# Patient Record
Sex: Female | Born: 1965 | Race: White | Hispanic: No | Marital: Married | Smoking: Never smoker
Health system: Southern US, Community
[De-identification: ages and names within clinical notes are randomized; demographics above are authoritative.]

## PROBLEM LIST (undated history)

## (undated) DIAGNOSIS — E611 Iron deficiency: Secondary | ICD-10-CM

## (undated) DIAGNOSIS — E785 Hyperlipidemia, unspecified: Secondary | ICD-10-CM

## (undated) DIAGNOSIS — F32A Depression, unspecified: Secondary | ICD-10-CM

## (undated) DIAGNOSIS — F329 Major depressive disorder, single episode, unspecified: Secondary | ICD-10-CM

## (undated) DIAGNOSIS — N959 Unspecified menopausal and perimenopausal disorder: Secondary | ICD-10-CM

## (undated) DIAGNOSIS — G43909 Migraine, unspecified, not intractable, without status migrainosus: Secondary | ICD-10-CM

## (undated) HISTORY — DX: Migraine, unspecified, not intractable, without status migrainosus: G43.909

## (undated) HISTORY — DX: Depression, unspecified: F32.A

## (undated) HISTORY — DX: Unspecified menopausal and perimenopausal disorder: N95.9

## (undated) HISTORY — DX: Major depressive disorder, single episode, unspecified: F32.9

## (undated) HISTORY — DX: Hyperlipidemia, unspecified: E78.5

## (undated) HISTORY — PX: DENTAL SURGERY: SHX609

---

## 2004-10-15 ENCOUNTER — Ambulatory Visit: Payer: Self-pay | Admitting: Internal Medicine

## 2005-06-16 ENCOUNTER — Ambulatory Visit: Payer: Self-pay | Admitting: Internal Medicine

## 2006-07-18 ENCOUNTER — Ambulatory Visit: Payer: Self-pay | Admitting: Internal Medicine

## 2007-08-04 ENCOUNTER — Ambulatory Visit: Payer: Self-pay | Admitting: Internal Medicine

## 2007-08-21 ENCOUNTER — Encounter: Payer: Self-pay | Admitting: Internal Medicine

## 2007-08-21 ENCOUNTER — Ambulatory Visit (HOSPITAL_COMMUNITY): Admission: RE | Admit: 2007-08-21 | Discharge: 2007-08-21 | Payer: Self-pay | Admitting: Internal Medicine

## 2007-08-21 ENCOUNTER — Ambulatory Visit: Payer: Self-pay | Admitting: Internal Medicine

## 2008-09-16 DIAGNOSIS — K5190 Ulcerative colitis, unspecified, without complications: Secondary | ICD-10-CM

## 2008-09-16 DIAGNOSIS — F329 Major depressive disorder, single episode, unspecified: Secondary | ICD-10-CM

## 2008-09-17 ENCOUNTER — Ambulatory Visit: Payer: Self-pay | Admitting: Internal Medicine

## 2008-09-18 ENCOUNTER — Encounter: Payer: Self-pay | Admitting: Internal Medicine

## 2008-09-18 ENCOUNTER — Encounter (INDEPENDENT_AMBULATORY_CARE_PROVIDER_SITE_OTHER): Payer: Self-pay | Admitting: *Deleted

## 2008-09-18 LAB — CONVERTED CEMR LAB
CO2: 22 meq/L
Chloride: 105 meq/L
Glucose, Bld: 135 mg/dL
Potassium: 3.9 meq/L
Sodium: 139 meq/L

## 2008-09-30 LAB — CONVERTED CEMR LAB
Calcium: 8.6 mg/dL (ref 8.4–10.5)
Glucose, Bld: 135 mg/dL — ABNORMAL HIGH (ref 70–99)
Potassium: 3.9 meq/L (ref 3.5–5.3)
Sodium: 139 meq/L (ref 135–145)

## 2008-10-08 ENCOUNTER — Encounter (INDEPENDENT_AMBULATORY_CARE_PROVIDER_SITE_OTHER): Payer: Self-pay | Admitting: *Deleted

## 2008-10-08 LAB — CONVERTED CEMR LAB
ALT: 16 units/L
BUN: 11 mg/dL
Calcium: 8.5 mg/dL
Cholesterol: 180 mg/dL
Creatinine, Ser: 0.72 mg/dL
Glomerular Filtration Rate, Af Am: >59 mL/min/{1.73_m2}
Glucose, Bld: 78 mg/dL
HCT: 38.4 %
HDL: 60 mg/dL
MCV: 93 fL
Platelets: 309 10*3/uL
Triglycerides: 123 mg/dL
Vit D, 25-Hydroxy: 42.4 ng/mL

## 2009-07-25 ENCOUNTER — Encounter: Payer: Self-pay | Admitting: Internal Medicine

## 2009-08-06 HISTORY — PX: COLONOSCOPY: SHX174

## 2009-08-11 ENCOUNTER — Ambulatory Visit: Payer: Self-pay | Admitting: Internal Medicine

## 2009-08-11 ENCOUNTER — Ambulatory Visit (HOSPITAL_COMMUNITY): Admission: RE | Admit: 2009-08-11 | Discharge: 2009-08-11 | Payer: Self-pay | Admitting: Internal Medicine

## 2009-08-13 ENCOUNTER — Encounter: Payer: Self-pay | Admitting: Internal Medicine

## 2009-08-25 ENCOUNTER — Encounter: Payer: Self-pay | Admitting: Internal Medicine

## 2009-08-29 ENCOUNTER — Encounter (INDEPENDENT_AMBULATORY_CARE_PROVIDER_SITE_OTHER): Payer: Self-pay

## 2010-04-09 NOTE — Letter (Signed)
Summary: Recall Colonoscopy/Endoscopy, Change to Office Visit  [facility]  [address]   [place], [place] [address]   Phone: [phone]  Fax: [phone]      August 29, 2009   [facility] [address] [place], [place]  [address] 1965-11-17   Dear Ms. Maddison,   According to our records, it is time for you to schedule a Colonoscopy/Endoscopy. However, after reviewing your medical record, we recommend an office visit in order to determine your need for a repeat procedure.  Please call [phone] at your convenience to schedule an office visit. If you have any questions or concerns, please feel free to contact our office.   Sincerely,   Waldon Merl LPN  [facility] Ph: [phone]   Fax: [phone]

## 2010-04-09 NOTE — Letter (Signed)
Summary: pathreport  pathreport   Imported By: Orma Flaming 08/25/2009 10:27:11  _____________________________________________________________________  External Attachment:    Type:   Image     Comment:   External Document

## 2010-04-09 NOTE — Letter (Signed)
Summary: Patient Notice, Colon Biopsy Results  [facility]  [address]   [place], [place] [address]   Phone: [phone]  Fax: [phone]       August 13, 2009   [facility] [address] [place],   [address] [birth year]    Dear Debra Taylor,  I am pleased to inform you that the biopsies taken during your recent colonoscopy did not show any evidence of cancer upon pathologic examination.  Colitis appears to be in remission which is great news.  Additional information/recommendations:  Continue with the treatment plan as outlined on the day of your exam.  You should have a repeat colonoscopy examination  in 2 years.  Please call us if you are having persistent problems or have questions about your condition that have not been fully answered at this time.  Sincerely,    R. Garfield Cornea MD, [facility] Ph: [phone]    Fax: [phone]   Appended Document: Patient Notice, Colon Biopsy Results letter mailed to pt  Appended Document: Patient Notice, Colon Biopsy Results reminder in computer

## 2010-04-09 NOTE — Letter (Signed)
Summary: Internal Other Lynne Logan  Internal Other Lynne Logan   Imported By: Waldon Merl LPN 91/36/8599 23:41:44  _____________________________________________________________________  External Attachment:    Type:   Image     Comment:   External Document

## 2010-04-22 ENCOUNTER — Encounter (INDEPENDENT_AMBULATORY_CARE_PROVIDER_SITE_OTHER): Payer: Self-pay | Admitting: *Deleted

## 2010-04-24 ENCOUNTER — Encounter: Payer: Self-pay | Admitting: Cardiology

## 2010-04-24 ENCOUNTER — Ambulatory Visit (INDEPENDENT_AMBULATORY_CARE_PROVIDER_SITE_OTHER): Payer: BC Managed Care – PPO | Admitting: Cardiology

## 2010-04-24 DIAGNOSIS — R079 Chest pain, unspecified: Secondary | ICD-10-CM

## 2010-04-24 DIAGNOSIS — E785 Hyperlipidemia, unspecified: Secondary | ICD-10-CM | POA: Insufficient documentation

## 2010-04-27 ENCOUNTER — Encounter (INDEPENDENT_AMBULATORY_CARE_PROVIDER_SITE_OTHER): Payer: Self-pay | Admitting: *Deleted

## 2010-04-27 LAB — CONVERTED CEMR LAB
ALT: 13 units/L
AST: 16 units/L
Alkaline Phosphatase: 57 units/L
Cholesterol: 151 mg/dL
GFR calc non Af Amer: 104 mL/min
Glomerular Filtration Rate, Af Am: 120 mL/min/{1.73_m2}
Glucose, Bld: 91 mg/dL
HCT: 40.5 %
Hemoglobin: 13.6 g/dL
LDL Cholesterol: 83 mg/dL
Potassium: 4.4 meq/L
Sodium: 140 meq/L
TSH: 2.27 microintl units/mL
Triglycerides: 70 mg/dL

## 2010-04-29 NOTE — Miscellaneous (Signed)
Summary: labs bmp,09/18/2008  Clinical Lists Changes  Observations: Added new observation of CALCIUM: 8.6 mg/dL (09/18/2008 11:05) Added new observation of CREATININE: 0.71 mg/dL (09/18/2008 11:05) Added new observation of BUN: 11 mg/dL (09/18/2008 11:05) Added new observation of BG RANDOM: 135 mg/dL (09/18/2008 11:05) Added new observation of CO2 PLSM/SER: 22 meq/L (09/18/2008 11:05) Added new observation of CL SERUM: 105 meq/L (09/18/2008 11:05) Added new observation of K SERUM: 3.9 meq/L (09/18/2008 11:05) Added new observation of NA: 139 meq/L (09/18/2008 11:05)

## 2010-04-29 NOTE — Letter (Signed)
Summary: Stress Echocardiogram Information Sheet  [facility]. [address] [place] [address]   Phone: [phone]  Fax: [phone]      April 24, 2010 MRN: [id number] light prior to the test.   Bradly Chris  Doctor: Appointment Date: Appointment Time: Appointment Location: [facility]  Stress Echocardiogram Information Sheet    Instructions:   1. You may take your medications the morning of test.  2. Do not eat or drink after midnight the night before test.  3. Dress prepared to exercise.  4. DO NOT use ANY caffine or tobacco products 3 hours before appointment.  5. Report to the [place] on the1st floor.  6. Please bring all current prescription medications.  7. If you have any questions, please call [phone]

## 2010-05-05 NOTE — Miscellaneous (Signed)
Summary: cmp,cbc,lipid  Clinical Lists Changes  Observations: Added new observation of VIT D 25-OH: 42.4 ng/mL (10/08/2008 14:06) Added new observation of CALCIUM: 8.5 mg/dL (10/08/2008 14:06) Added new observation of ALBUMIN: 3.7 g/dL (10/08/2008 14:06) Added new observation of PROTEIN, TOT: 6.8 g/dL (10/08/2008 14:06) Added new observation of SGPT (ALT): 16 units/L (10/08/2008 14:06) Added new observation of SGOT (AST): 21 units/L (10/08/2008 14:06) Added new observation of ALK PHOS: 59 units/L (10/08/2008 14:06) Added new observation of BILI DIRECT: total bili   0.2 mg/dL (10/08/2008 14:06) Added new observation of GFR AA: >59 mL/min/1.43m (10/08/2008 14:06) Added new observation of GFR: >59 mL/min (10/08/2008 14:06) Added new observation of CREATININE: 0.72 mg/dL (10/08/2008 14:06) Added new observation of BUN: 11 mg/dL (10/08/2008 14:06) Added new observation of BG RANDOM: 78 mg/dL (10/08/2008 14:06) Added new observation of CO2 PLSM/SER: 22 meq/L (10/08/2008 14:06) Added new observation of CL SERUM: 102 meq/L (10/08/2008 14:06) Added new observation of K SERUM: 4.2 meq/L (10/08/2008 14:06) Added new observation of NA: 136 meq/L (10/08/2008 14:06) Added new observation of LDL: 95 mg/dL (10/08/2008 14:06) Added new observation of HDL: 60 mg/dL (10/08/2008 14:06) Added new observation of TRIGLYC TOT: 123 mg/dL (10/08/2008 14:06) Added new observation of CHOLESTEROL: 180 mg/dL (10/08/2008 14:06) Added new observation of PLATELETK/UL: 309 K/uL (10/08/2008 14:06) Added new observation of MCV: 93 fL (10/08/2008 14:06) Added new observation of HCT: 38.4 % (10/08/2008 14:06) Added new observation of HGB: 12.9 g/dL (10/08/2008 14:06) Added new observation of WBC COUNT: 6.1 10*3/microliter (10/08/2008 14:06)

## 2010-05-05 NOTE — Assessment & Plan Note (Signed)
Summary: [facility]   Visit Type:  Initial Consult Referring Provider:  [facility] Primary Provider:  [facility]-Dr. Gar Ponto   History of Present Illness: Debra Taylor is evaluated in the office today at the kind request of Dr. Gar Ponto for chest discomfort.  This nice young woman has enjoyed generally excellent health except for a 10-15 year history of ulcerative colitis, which has been fairly well controlled with medical therapy.  She has no known cardiac disease and has never previously been evaluated by a cardiologist.  There is no history of hypertension, hyperlipidemia or diabetes.  She does have a positive family history with both her father and one of 2 brothers suffered myocardial infarction at fairly young ages.  For approximately the past year, [name] has experienced intermittent chest discomfort unrelated to exertion.  This is of mild to moderate severity, substernal without radiation or associated symptoms.  A recent more severe and prolonged episode prompted this referral.  Current Medications (verified): 1)  Asacol 400 Mg Tbec (Mesalamine) .... Take 2 Tabs Twice A Day 2)  Maxalt 10 Mg Tabs (Rizatriptan Benzoate) .... As Needed 3)  Vitamin D 1000 Unit Tabs (Cholecalciferol) .... Once Daily 4)  Tri-Sprintec 0.18/0.215/0.25 Mg-35 Mcg Tabs (Norgestim-Eth Estrad Triphasic) .... Once Daily 5)  Topamax 25 Mg Tabs (Topiramate) .... Take 1 Tab Two Times A Day 6)  Tums 500 Mg Chew (Calcium Carbonate Antacid) .... 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[employer] Tobacco-none Alcohol Use - no  Past Medical History: Chest pain Hyperlipidemia Premenopausal-contraception Depression Ulcerative colitis-onset age 7 Migraine headaches  Past Surgical History: Colonoscopy-08/2009  CXR  Procedure date:  10/14/2009  Findings:      Normal cardiac silhouette Low-lying diaphragm Within normal limits  EKG  Procedure date:  10/14/2009  Findings:      Sinus bradycardia at a rate of 44 Otherwise within normal limits   Family History: Father: alive at age 46- MI, phlebitis,breathing problems Mother: alive at age 50 died- ovarian and uterine cancer Siblings:2 brothers, one of whom has suffered myocardial infarction in his 30s  No FH of Colon Cancer:  Social History: Marital Status: Married; no children Children: none Occupation: Probation officer [employer] Tobacco-none Alcohol Use - no  Review of Systems       History of migraine headaches; corrective lenses required; oral contraceptives utilized for birth control.  All other systems reviewed and are negative.  Vital Signs:  Patient profile:   45 year old female Weight:      159 pounds BMI:     27.39 O2 Sat:      92 % on Room air Pulse rate:   63 / minute BP sitting:   109 / 67  (left arm)  Vitals Entered By: Doretha Sou, CNA (April 24, 2010 9:48 AM)  O2 Flow:  Room air  Physical Exam  General:  Thin; well-developed; no acute distress: HEENT-[place]/AT; PERRL; EOM intact; conjunctiva and lids nl; arched palate Neck-No JVD; no carotid bruits: Endocrine-No thyromegaly: Lungs-No tachypnea, clear without rales, rhonchi or wheezes; straight back CV-normal PMI; normal S1 and S2:;  Abdomen-BS normal; soft  and non-tender without masses or organomegaly: MS-No deformities, cyanosis or clubbing: Neurologic-Nl cranial nerves; symmetric strength and tone: Skin- Warm, no sig. lesions: Extremities-Nl distal pulses; no edema;  no arachnodactyly    Impression & Recommendations:  Problem # 1:  CHEST PAIN (ICD-786.50) Chest discomfort is atypical, and risk factors are few.  Patient has some anatomic features associated with mitral valve prolapse, but no findings on exam to suggest this diagnosis.  A stress echocardiogram will be performed to exclude ischemic heart disease and structural heart disease.  Current symptoms are most suggestive of a GI etiology.  Empiric therapy with antacids suggested.  If stress testing is negative,  I will reevaluate this nice woman's symptoms in 3 months.  Problem # 2:  HYPERLIPIDEMIA (YBR-493.4) Patient has a vague history of hyperlipidemia; lipid profile will be obtained.  Other Orders: Stress Echo (Stress Echo)  Patient Instructions: 1)  Your physician recommends that you schedule a follow-up appointment in: 3 months 2)  Your physician has recommended you make the following change in your medication: You may take Tums or Mylanta for chest pain 3)  Your physician has requested that you have a stress echocardiogram. For further information please visit [email or web address].  Please follow instruction sheet as given.

## 2010-05-05 NOTE — Miscellaneous (Addendum)
Summary: cmp,cbc,lipid,lipid,tsh per Dr. Olena Heckle  Clinical Lists Changes  Observations: Added new observation of CALCIUM: 8.9 mg/dL (04/27/2010 14:14) Added new observation of ALBUMIN: 4.0 g/dL (04/27/2010 14:14) Added new observation of PROTEIN, TOT: 7.0 g/dL (04/27/2010 14:14) Added new observation of SGPT (ALT): 13 units/L (04/27/2010 14:14) Added new observation of SGOT (AST): 16 units/L (04/27/2010 14:14) Added new observation of ALK PHOS: 57 units/L (04/27/2010 14:14) Added new observation of BILI DIRECT: total bili   0.2 mg/dL (04/27/2010 14:14) Added new observation of GFR AA: 120 mL/min/1.100m (04/27/2010 14:14) Added new observation of GFR: 104 mL/min (04/27/2010 14:14) Added new observation of CREATININE: 0.71 mg/dL (04/27/2010 14:14) Added new observation of BUN: 7 mg/dL (04/27/2010 14:14) Added new observation of BG RANDOM: 91 mg/dL (04/27/2010 14:14) Added new observation of CO2 PLSM/SER: 21 meq/L (04/27/2010 14:14) Added new observation of CL SERUM: 106 meq/L (04/27/2010 14:14) Added new observation of K SERUM: 4.4 meq/L (04/27/2010 14:14) Added new observation of NA: 140 meq/L (04/27/2010 14:14) Added new observation of LDL: 83 mg/dL (04/27/2010 14:14) Added new observation of HDL: 54 mg/dL (04/27/2010 14:14) Added new observation of TRIGLYC TOT: 70 mg/dL (04/27/2010 14:14) Added new observation of CHOLESTEROL: 151 mg/dL (04/27/2010 14:14) Added new observation of PLATELETK/UL: 289 K/uL (04/27/2010 14:14) Added new observation of MCV: 94 fL (04/27/2010 14:14) Added new observation of HCT: 40.5 % (04/27/2010 14:14) Added new observation of HGB: 13.6 g/dL (04/27/2010 14:14) Added new observation of WBC COUNT: 6.2 10*3/microliter (04/27/2010 14:14) Added new observation of TSH: 2.270 microintl units/mL (04/27/2010 14:14)

## 2010-05-07 ENCOUNTER — Ambulatory Visit (HOSPITAL_COMMUNITY)
Admission: RE | Admit: 2010-05-07 | Discharge: 2010-05-07 | Disposition: A | Discharge status: Home / Self Care | Payer: BC Managed Care – PPO | Source: Ambulatory Visit | Attending: Cardiology | Admitting: Cardiology

## 2010-05-07 ENCOUNTER — Ambulatory Visit (HOSPITAL_COMMUNITY): Payer: BC Managed Care – PPO | Attending: Cardiology

## 2010-05-07 ENCOUNTER — Encounter: Payer: Self-pay | Admitting: Cardiology

## 2010-05-07 DIAGNOSIS — R0789 Other chest pain: Secondary | ICD-10-CM | POA: Insufficient documentation

## 2010-05-07 DIAGNOSIS — R072 Precordial pain: Secondary | ICD-10-CM

## 2010-05-07 DIAGNOSIS — R079 Chest pain, unspecified: Secondary | ICD-10-CM | POA: Insufficient documentation

## 2010-05-15 ENCOUNTER — Encounter: Payer: Self-pay | Admitting: Internal Medicine

## 2010-05-19 NOTE — Letter (Signed)
Summary: CLINIC NOTE FROM DR [facility] NOTE FROM DR Quillian Quince   Imported By: Hoy Morn 05/15/2010 10:55:15  _____________________________________________________________________  External Attachment:    Type:   Image     Comment:   External Document

## 2010-05-25 LAB — BASIC METABOLIC PANEL
BUN: 7 mg/dL (ref 6–23)
CO2: 26 mEq/L (ref 19–32)
Chloride: 105 mEq/L (ref 96–112)
Creatinine, Ser: 0.66 mg/dL (ref 0.4–1.2)
Glucose, Bld: 82 mg/dL (ref 70–99)
Potassium: 3.6 mEq/L (ref 3.5–5.1)

## 2010-06-18 ENCOUNTER — Ambulatory Visit: Payer: BC Managed Care – PPO | Attending: Family Medicine | Admitting: Audiology

## 2010-06-18 DIAGNOSIS — Z0110 Encounter for examination of ears and hearing without abnormal findings: Secondary | ICD-10-CM | POA: Insufficient documentation

## 2010-06-18 DIAGNOSIS — Z0389 Encounter for observation for other suspected diseases and conditions ruled out: Secondary | ICD-10-CM | POA: Insufficient documentation

## 2010-07-21 NOTE — H&P (Signed)
Debra Taylor, Debra Taylor              ACCOUNT NO.:  [id number]   MEDICAL RECORD NO.:  [id number]          PATIENT TYPE:  AMB   LOCATION:  DAY                           FACILITY:  [facility]   PHYSICIAN:  R. Garfield Cornea, M.D. DATE OF BIRTH:  [birth year]   DATE OF ADMISSION:  DATE OF DISCHARGE:  LH                              HISTORY & PHYSICAL   PRIMARY CARE Jesilyn Easom:  Gar Ponto, MD, [place], [place].   CHIEF COMPLAINT:  A 10 plus years history of UC.   HISTORY:  Debra Taylor is a very pleasant 45 year old lady with  well-documented ulcerative colitis who has been maintained in remission  on Asacol 2 tablets orally twice daily.  She has one bowel movement  daily.  She has not had any tenesmus, abdominal pain, or hematochezia.  Last colonoscopy was in 2000 done by me at [facility].  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  She has done well,  although she has had flares and temporarily related to antibiotic  therapy along the way.  She is not taking any nonsteroidals.  She has  not had any intercurrent illness, although she tells me her B12 and iron  level maybe a little low, and she was having lab work done at Dr.  Arcola Jansky office.  I do not see a recent creatinine on this nice lady.  She was last seen here on Jul 18, 2006.   There is no family history of GI malignancy, although her grandfather  has colonic polyps.   PAST MEDICAL HISTORY:  Significant for ulcerative colitis and  depression.  She has seen Dr. Clarene Essex down in [place] back in 1996  and had a flexible sigmoidoscopy without any definitive findings.   CURRENT MEDICATIONS:  1. Asacol 800 mg b.i.d.  2. Prozac 20 mg daily.  3. Maxalt 10 mg p.r.n.  4. Tylenol p.r.n.  5. Vitamin B12 injections.  6. Birth control pill daily.   ALLERGIES:  No known drug allergies.   FAMILY HISTORY:  As outlined above.  There is no history of inflammatory  bowel disease.   SOCIAL HISTORY:  The  patient is married, lives in [place].  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  She  rarely consumes alcohol.  No tobacco use.   REVIEW OF SYSTEMS:  No chest pain.  No dyspnea on exertion.  No fever.  No odynophagia, dysphagia, early satiety, reflux symptoms, nausea, or  vomiting.  Weight is stable at 163 pounds.   PHYSICAL EXAMINATION:  GENERAL:  This is a pleasant, well-groomed 38-  year-old lady.  VITAL SIGNS:  Weight 163, height 5 feet 4 inches, temperature 97.5,  blood pressure 112/70, and pulse 80.  SKIN:  Warm and dry.  There is no jaundice.  HEENT:  No scleral icterus.  Conjunctivae pink.  CHEST/LUNGS:  Clear to auscultation.  CARDIAC:  Regular rate and rhythm without murmur, gallop, or rub.  ABDOMEN:  Nondistended.  Positive bowel sounds.  Soft and entirely  nontender without appreciable mass or organomegaly.  EXTREMITIES:  No  edema.  RECTAL:  Deferred at the time of colonoscopy.   IMPRESSION:  Debra Taylor is a 45 year old lady with panulcerative  colitis nicely maintained in remission on relative low dose of Asacol.  She has probably had the disease for a good 14 years, although  definitive diagnosis was not made until 2000.  Time course of disease at  this point does begin to slightly increase her risk of colon cancer.  Duration of her illness, now dictates a closer surveillance to this, and  I have offered Debra Taylor a colonoscopy with segmental biopsies to look  for dysplasia.  Risk, benefits, alternatives, and limitations have been  reviewed.  Questions answered.  All parties agreeable.   PLAN:  I will perform colonoscopy at some time in the month of June.  I  do not see where she has had a recent serum creatinine.  She had some  lab work done with Dr. Quillian Quince and is suppose to have some more lab work  done next month.  We will see that she has a recent creatinine to  document renal function while on long-term mesalamine  therapy.      Bridgette Habermann, M.D.  Electronically Signed     RMR/MEDQ  D:  08/04/2007  T:  08/05/2007  Job:  [id number]   cc:   Gar Ponto  Fax: [phone]

## 2010-07-21 NOTE — Op Note (Signed)
Debra Taylor, Debra Taylor              ACCOUNT NO.:  [id number]   MEDICAL RECORD NO.:  [id number]          PATIENT TYPE:  AMB   LOCATION:  DAY                           FACILITY:  [facility]   PHYSICIAN:  R. Garfield Cornea, M.D. DATE OF BIRTH:  [birth year]   DATE OF PROCEDURE:  08/21/2007  DATE OF DISCHARGE:                               OPERATIVE REPORT   PROCEDURE PERFORMED:  Ileocolonoscopy and segmental biopsy.   INDICATIONS FOR PROCEDURE:  This 45 year old lady with a greater than 10  years history of pain and proctocolitis, well controlled on Asacol, here  for surveillance colonoscopy.  She missed her Asacol for over a day  recently and has flare in her symptoms.  She bumped her Asacol to 2  tablets 3 times a day from b.i.d. on her own with improved control of  her symptoms.  This approach has been discussed with the patient.  Risk,  benefits, alternatives, and limitations have been reviewed.  Please see  documentation in the medical record.   PROCEDURE NOTE:  O2 saturation, blood pressure, pulse of the patient  monitored throughout the entire procedure.   CONSCIOUS SEDATION:  Versed 7 mg IV and Demerol 125 mg IV divided doses.   FINDINGS:  Digital rectal exam revealed no abnormalities.  Endoscopic  findings:  The prep was good.  Colon:  Colonic mucosa was surveyed from  the rectosigmoid junction through the left transverse and right colon,  appendiceal orifice, ileocecal valve, and cecum.  These structures were  well seen and photographed for the record.  Terminal ileum was intubated  easily and intubated to 20 cm.  From this level, scope was slowly  withdrawn and all previously mentioned mucosal surfaces were again seen.  The patient had diffuse inflammatory changes of the colonic mucosa  including granularity, friability, and superficial erosion.  The  vascular pattern was more or less preserved all the way around.  The  terminal ileum mucosa appeared normal.  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  The ileocecal valve was patulous.  There was no evidence of mass, polyp, or cancer.  Segmental biopsies  were taken on the way out as described above.  The scope was pulled down  to the rectal mucosa where the similar inflammatory changes were found  with no other abnormalities observed.  Retroflexion of the distal rectum  was undertaken.  No other abnormalities were observed above the rectal  mucosa was biopsied.  The patient tolerated the procedure well.  He was  reacted in Endoscopy.   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   RECOMMENDATIONS:  Continue Asacol 3 tablets t.i.d. for the next month  and can try dropping back to 2 tablets twice daily.  Compliance with  Asacol emphasized.      Bridgette Habermann, M.D.  Electronically Signed     RMR/MEDQ  D:  08/21/2007  T:  08/21/2007  Job:  [id number]   cc:   Gar Ponto  Fax: [phone]

## 2010-07-21 NOTE — Assessment & Plan Note (Signed)
NAMEMarland Kitchen  RAVNEET, SPILKER               CHART#:  [id number]   DATE:  07/18/2006                       DOB:  Feb 19, 1966   Followup ulcerative colitis.  Last seen June 16, 2005.   She has done well until the first of the year when she was sinus/URI.  She was given Augmentin, Z-Pak and most recently Augmentin to clear this  up when she was seen over at [facility].  Intermittently, she has had  some tenesmus and some mucous blood-like stools during the antibiotic  therapy.  Her stools started to revert to normal one week ago, and she  is having one bowel movement daily to every other day at this point in  time.  She is in for a recheck.  Has had no fever or chills.  No  abdominal pain today, no upper GI tract symptoms.  There is no family  history of colorectal neoplasia.  She is due for a surveillance  colonoscopy in 2010.   CURRENT MEDICATIONS:  See updated list.   ALLERGIES:  No known drug allergies.  She is not taking any  nonsteroidals.   Today she looks well.  Weight 163, down from 177 (she is trying to loose weight, going to  [employer], etc.). Height 5 feet 4 inches, temperature 98.1, blood  pressure 120/70, pulse 62.  SKIN:  Warm and dry.  ABDOMEN:  Flat.  Positive bowel sounds.  Entirely soft and nontender  without appreciable mass or organomegaly.   ASSESSMENT:  History of ulcerative colitis with possible flare versus  antibiotic-associated colitis superimposed on ulcerative colitis.  Symptoms clinically improved at this point in time.   RECOMMENDATIONS:  Give her a 2 week course of Align 1 capsule daily,  samples through the office.  I told her, in the future, if she does have  to go on antibiotics, she should go on them, and would also recommend  she go on a probiotic supplement for not only the period of time she is  on antibiotics, but extend probiotic therapy for another 2 weeks.  Will  also go ahead and have her return 3 hemoccult cards, but not to do them  until 4 weeks from now, give things a chance to settle down.  Assuming  they are negative, she is to continue her Asacol 2 tablets b.i.d.  Will  plan to see this nice lady back in one year's time.      Bridgette Habermann, M.D.  Electronically Signed    RMR/MEDQ  D:  07/18/2006  T:  07/19/2006  Job:  [id number]   cc:   Gar Ponto

## 2010-07-23 ENCOUNTER — Ambulatory Visit: Payer: BC Managed Care – PPO | Admitting: Cardiology

## 2011-02-02 ENCOUNTER — Encounter: Payer: Self-pay | Admitting: Cardiology

## 2011-07-28 ENCOUNTER — Encounter: Payer: Self-pay | Admitting: Gastroenterology

## 2011-08-10 ENCOUNTER — Ambulatory Visit: Payer: BC Managed Care – PPO | Admitting: Gastroenterology

## 2011-08-17 ENCOUNTER — Ambulatory Visit: Payer: BC Managed Care – PPO | Admitting: Gastroenterology

## 2011-08-18 ENCOUNTER — Encounter: Payer: Self-pay | Admitting: Internal Medicine

## 2011-08-24 ENCOUNTER — Other Ambulatory Visit: Payer: Self-pay

## 2011-08-24 ENCOUNTER — Ambulatory Visit (INDEPENDENT_AMBULATORY_CARE_PROVIDER_SITE_OTHER): Payer: BC Managed Care – PPO | Admitting: Gastroenterology

## 2011-08-24 ENCOUNTER — Encounter: Payer: Self-pay | Admitting: Gastroenterology

## 2011-08-24 VITALS — BP 108/60 | HR 49 | Temp 98.8°F | Ht 64.0 in | Wt 152.4 lb

## 2011-08-24 DIAGNOSIS — B3731 Acute candidiasis of vulva and vagina: Secondary | ICD-10-CM | POA: Insufficient documentation

## 2011-08-24 DIAGNOSIS — B373 Candidiasis of vulva and vagina: Secondary | ICD-10-CM

## 2011-08-24 DIAGNOSIS — N912 Amenorrhea, unspecified: Secondary | ICD-10-CM | POA: Insufficient documentation

## 2011-08-24 DIAGNOSIS — K5190 Ulcerative colitis, unspecified, without complications: Secondary | ICD-10-CM

## 2011-08-24 MED ORDER — FLUCONAZOLE 150 MG PO TABS: 150.0000 mg | ORAL_TABLET | Freq: Once | ORAL | Status: AC

## 2011-08-24 NOTE — Assessment & Plan Note (Signed)
Trial of diflucan 187m X1 and repeat in one week if needed. For persistent vaginal itching, she should f/u of with gyn.

## 2011-08-24 NOTE — Assessment & Plan Note (Signed)
No menstrual cycle since stopping birth control pills in 05/2011. Check HCG.

## 2011-08-24 NOTE — Patient Instructions (Addendum)
Please do not start treatment for your yeast infection until you have heard back from the pregnancy test.

## 2011-08-24 NOTE — Progress Notes (Signed)
Primary Care Physician:  Gar Ponto, MD  Primary Gastroenterologist:  Garfield Cornea, MD   Chief Complaint  Patient presents with  . Colonoscopy    HPI:  Debra Taylor is a 46 y.o. female here to schedule her 2 year surveillance colonoscopy given her h/o chronic pan ulcerative colitis. She was diagnosed around age 28. Last TCS 2011 as outlined below.  Switched to generic Asacol about two weeks ago. Increased stress for couple of months with extra classes. Increased Asacol TID due to mild UC flare. Still very gassy. Has some mild rectal bleeding but went away with increased Asacol dosing. BM when doing well, one daily. Now still one daily but not "good one" and lots of gas. No heartburn. Some epigastric bloating/gas like. Doesn't like new generic Asacol because it has to be taking one hour before meals or two hours after. Difficult to fit into schedule and finds that she is missing doses.  Current Outpatient Prescriptions  Medication Sig Dispense Refill  . calcium carbonate (TUMS - DOSED IN MG ELEMENTAL CALCIUM) 500 MG chewable tablet Chew 1 tablet by mouth as needed.        . mesalamine (ASACOL) 400 MG EC tablet Take 800 mg by mouth 2 (two) times daily.        . rizatriptan (MAXALT) 10 MG tablet Take 10 mg by mouth as needed. May repeat in 2 hours if needed       . topiramate (TOPAMAX) 25 MG tablet Take 25 mg by mouth 2 (two) times daily.          Allergies as of 08/24/2011  . (No Known Allergies)    Past Medical History  Diagnosis Date  . Chest pain   . Hyperlipidemia   . Premenopausal patient     Conctraception  . Depression   . Ulcerative colitis     Onset at age 34  . Migraine headache     Past Surgical History  Procedure Date  . Colonoscopy 08/2009    normal terminal ileum/inflammatory changes of the rectum and colon with panproctocolitis. No dysplasia on bx.     Family History  Problem Relation Age of Onset  . Uterine cancer Mother   . Ovarian cancer Mother     . Heart attack Brother   . Colon cancer Neg Hx     History   Social History  . Marital Status: Married    Spouse Name: N/A    Number of Children: N/A  . Years of Education: N/A   Occupational History  . [place] History Main Topics  . Smoking status: Never Smoker   . Smokeless tobacco: Not on file  . Alcohol Use: No  . Drug Use: No  . Sexually Active: Not on file   Other Topics Concern  . Not on file   Social History Narrative   Married, no children      ROS:  General: Negative for anorexia, weight loss, fever, chills, fatigue, weakness. Eyes: Negative for vision changes.  ENT: Negative for hoarseness, difficulty swallowing , nasal congestion. CV: Negative for chest pain, angina, palpitations, dyspnea on exertion, peripheral edema.  Respiratory: Negative for dyspnea at rest, dyspnea on exertion, cough, sputum, wheezing.  GI: See history of present illness. GU:  Negative for dysuria, hematuria, urinary incontinence, urinary frequency, nocturnal urination.  MS: Negative for joint pain, low back pain.  Derm: Negative for rash or itching.  Neuro: Negative for weakness, abnormal sensation, seizure,  frequent headaches, memory loss, confusion.  Psych: Negative for anxiety, depression, suicidal ideation, hallucinations.  Endo: Negative for unusual weight change.  Heme: Negative for bruising or bleeding. Allergy: Negative for rash or hives.    Physical Examination:  BP 108/60  Pulse 49  Temp 98.8 F (37.1 C) (Temporal)  Ht 5' 4"  (1.626 m)  Wt 152 lb 6.4 oz (69.128 kg)  BMI 26.16 kg/m2  LMP 05/17/2011   General: Well-nourished, well-developed in no acute distress.  Head: Normocephalic, atraumatic.   Eyes: Conjunctiva pink, no icterus. Mouth: Oropharyngeal mucosa moist and pink , no lesions erythema or exudate. Neck: Supple without thyromegaly, masses, or lymphadenopathy.  Lungs: Clear to auscultation bilaterally.  Heart:  Regular rate and rhythm, no murmurs rubs or gallops.  Abdomen: Bowel sounds are normal, nontender, nondistended, no hepatosplenomegaly or masses, no abdominal bruits or    hernia , no rebound or guarding.   Rectal: defer Extremities: No lower extremity edema. No clubbing or deformities.  Neuro: Alert and oriented x 4 , grossly normal neurologically.  Skin: Warm and dry, no rash or jaundice.   Psych: Alert and cooperative, normal mood and affect.  Labs: Lab Results  Component Value Date   CREATININE 0.71 04/27/2010   BUN 7 04/27/2010   NA 140 04/27/2010   K 4.4 04/27/2010   CL 106 04/27/2010   CO2 21 04/27/2010     Imaging Studies: No results found.

## 2011-08-24 NOTE — Progress Notes (Signed)
Quick Note:  Pt aware ______ 

## 2011-08-24 NOTE — Assessment & Plan Note (Signed)
Due for two year surveillace TCS given chronic pan UC. Wants Miralax prep as she has not tolerated trilytely type preps in past (N/V).  I have discussed the risks, alternatives, benefits with regards to but not limited to the risk of reaction to medication, bleeding, infection, perforation and the patient is agreeable to proceed. Written consent to be obtained.  Recent mild UC flare, better with increased Asacol to TID. Difficulty taking new generic due to need to take on empty stomach. When she has completed meds at home, I would consider switching to Lialda 2.4g once daily. She will call for RX and rebate card at that time.

## 2011-08-24 NOTE — Progress Notes (Signed)
Quick Note:  Please let pt know, pregnancy test is negative. Can start diflucan and tcs as planned. ______

## 2011-08-26 ENCOUNTER — Telehealth: Payer: Self-pay

## 2011-08-26 NOTE — Progress Notes (Signed)
Faxed to PCP

## 2011-08-26 NOTE — Telephone Encounter (Addendum)
Pt called and wanted to reschedule colonoscopy from 09/10/2011. Given appt for 09/16/2011 @ 10:30 AM. Maudie Mercury is aware. ( However Maudie Mercury said that Dr. Gala Romney will not be in OR early that day and I can schedule the pt earlier. Called back. LMOM for a return call to schedule earlier. Pt called back and was given the appt for 7:30 AM on 09/16/2011. Maudie Mercury is aware.

## 2011-08-31 ENCOUNTER — Encounter (HOSPITAL_COMMUNITY): Payer: Self-pay | Admitting: Pharmacy Technician

## 2011-09-15 MED ORDER — SODIUM CHLORIDE 0.45 % IV SOLN
Freq: Once | INTRAVENOUS | Status: AC
  Administered 2011-09-16: 07:00:00 via INTRAVENOUS

## 2011-09-16 ENCOUNTER — Encounter (HOSPITAL_COMMUNITY): Payer: Self-pay | Admitting: *Deleted

## 2011-09-16 ENCOUNTER — Ambulatory Visit (HOSPITAL_COMMUNITY)
Admission: RE | Admit: 2011-09-16 | Discharge: 2011-09-16 | Disposition: A | Discharge status: Home / Self Care | Payer: BC Managed Care – PPO | Source: Ambulatory Visit | Attending: Internal Medicine | Admitting: Internal Medicine

## 2011-09-16 ENCOUNTER — Encounter (HOSPITAL_COMMUNITY)
Admission: RE | Disposition: A | Discharge status: Home / Self Care | Payer: Self-pay | Source: Ambulatory Visit | Attending: Internal Medicine

## 2011-09-16 DIAGNOSIS — K5190 Ulcerative colitis, unspecified, without complications: Secondary | ICD-10-CM

## 2011-09-16 DIAGNOSIS — K5180 Other ulcerative colitis without complications: Secondary | ICD-10-CM | POA: Insufficient documentation

## 2011-09-16 DIAGNOSIS — Z1211 Encounter for screening for malignant neoplasm of colon: Secondary | ICD-10-CM

## 2011-09-16 DIAGNOSIS — E785 Hyperlipidemia, unspecified: Secondary | ICD-10-CM | POA: Insufficient documentation

## 2011-09-16 HISTORY — PX: COLONOSCOPY: SHX5424

## 2011-09-16 SURGERY — COLONOSCOPY
Anesthesia: Moderate Sedation

## 2011-09-16 MED ORDER — MIDAZOLAM HCL 5 MG/5ML IJ SOLN
INTRAMUSCULAR | Status: AC
  Filled 2011-09-16: qty 10

## 2011-09-16 MED ORDER — MIDAZOLAM HCL 5 MG/5ML IJ SOLN
INTRAMUSCULAR | Status: DC | PRN
  Administered 2011-09-16: 2 mg via INTRAVENOUS
  Administered 2011-09-16: 1 mg via INTRAVENOUS
  Administered 2011-09-16 (×2): 2 mg via INTRAVENOUS
  Administered 2011-09-16: 1 mg via INTRAVENOUS

## 2011-09-16 MED ORDER — MEPERIDINE HCL 100 MG/ML IJ SOLN
INTRAMUSCULAR | Status: AC
  Filled 2011-09-16: qty 2

## 2011-09-16 MED ORDER — STERILE WATER FOR IRRIGATION IR SOLN
Status: DC | PRN
  Administered 2011-09-16: 08:00:00

## 2011-09-16 MED ORDER — MEPERIDINE HCL 100 MG/ML IJ SOLN
INTRAMUSCULAR | Status: DC | PRN
  Administered 2011-09-16: 50 mg via INTRAVENOUS
  Administered 2011-09-16 (×2): 25 mg via INTRAVENOUS

## 2011-09-16 NOTE — Op Note (Signed)
[facility] [address] [place], [place]  [address]  COLONOSCOPY PROCEDURE REPORT  PATIENT:  Debra, Taylor  MR#:  [id number] BIRTHDATE:  1965-06-28, 46 yrs. old  GENDER:  female ENDOSCOPIST:  R. Garfield Cornea, MD FACP [facility] REF. BY:   Dr. Gar Ponto PROCEDURE DATE:  09/16/2011 PROCEDURE:  Surveillance ileocolonoscopy with segmental biopsy  INDICATIONS:  16+ year history of  pan-ulcerative colitis  INFORMED CONSENT:  The risks, benefits, alternatives and imponderables including but not limited to bleeding, perforation as well as the possibility of a missed lesion have been reviewed. The potential for biopsy, lesion removal, etc. have also been discussed.  Questions have been answered.  All parties agreeable. Please see the history and physical in the medical record for more information.  MEDICATIONS:  Versed 8 mg IV and Demerol 100 mg IV in divided doses.  DESCRIPTION OF PROCEDURE:  After a digital rectal exam was performed, the EC-[id number] ([id number]) colonoscope was advanced from the anus through the rectum and colon to the area of the cecum, ileocecal valve and appendiceal orifice.  The cecum was deeply intubated.  These structures were well-seen and photographed for the record.  From the level of the cecum and ileocecal valve, the scope was slowly and cautiously withdrawn.  The mucosal surfaces were carefully surveyed utilizing scope tip deflection to facilitate fold flattening as needed.  The scope was pulled down into the rectum where a thorough examination including retroflexion was performed. <<PROCEDUREIMAGES>>  FINDINGS: Adequate preparation. Diffuse granularity of the rectal mucosa with patchy loss of the normal vascular pattern. No erosion or frank ulceration. The no other abnormality observed. There was mild patchy erythema and granularity of the colonic mucosa from the rectosigmoid junction to the cecum. There was preservation of the normal vascular  pattern. I did not see any mass lesions anywhere.  The distal 10 cm of terminal ileal mucosa appeared normal.  THERAPEUTIC / DIAGNOSTIC MANEUVERS PERFORMED:  Multiple segment mental biopsies were taken throughout the colon and rectum for histologic study.  COMPLICATIONS:  None  CECAL WITHDRAWAL TIME:  12 minutes  IMPRESSION:  Findings consistent with pan ulcerative colitis-endoscopically mild appearing. Status post segmental biopsy.  RECOMMENDATIONS:   Followup on pathology.  ______________________________ R. Garfield Cornea, MD Quentin Ore  CC:  n. eSIGNED:   R. Garfield Cornea at 09/16/2011 08:21 AM  Bradly Chris, [phone]

## 2011-09-16 NOTE — Interval H&P Note (Signed)
History and Physical Interval Note:  09/16/2011 7:40 AM  Debra Taylor  has presented today for surgery, with the diagnosis of UC  The various methods of treatment have been discussed with the patient and family. After consideration of risks, benefits and other options for treatment, the patient has consented to  Procedure(s) (LRB): COLONOSCOPY (N/A) as a surgical intervention .  The patient's history has been reviewed, patient examined, no change in status, stable for surgery.  I have reviewed the patients' chart and labs.  Questions were answered to the patient's satisfaction.     Manus Rudd

## 2011-09-16 NOTE — H&P (View-Only) (Signed)
Primary Care Physician:  Gar Ponto, MD  Primary Gastroenterologist:  Garfield Cornea, MD   Chief Complaint  Patient presents with  . Colonoscopy    HPI:  Debra Taylor is a 46 y.o. female here to schedule her 2 year surveillance colonoscopy given her h/o chronic pan ulcerative colitis. She was diagnosed around age 37. Last TCS 2011 as outlined below.  Switched to generic Asacol about two weeks ago. Increased stress for couple of months with extra classes. Increased Asacol TID due to mild UC flare. Still very gassy. Has some mild rectal bleeding but went away with increased Asacol dosing. BM when doing well, one daily. Now still one daily but not "good one" and lots of gas. No heartburn. Some epigastric bloating/gas like. Doesn't like new generic Asacol because it has to be taking one hour before meals or two hours after. Difficult to fit into schedule and finds that she is missing doses.  Current Outpatient Prescriptions  Medication Sig Dispense Refill  . calcium carbonate (TUMS - DOSED IN MG ELEMENTAL CALCIUM) 500 MG chewable tablet Chew 1 tablet by mouth as needed.        . mesalamine (ASACOL) 400 MG EC tablet Take 800 mg by mouth 2 (two) times daily.        . rizatriptan (MAXALT) 10 MG tablet Take 10 mg by mouth as needed. May repeat in 2 hours if needed       . topiramate (TOPAMAX) 25 MG tablet Take 25 mg by mouth 2 (two) times daily.          Allergies as of 08/24/2011  . (No Known Allergies)    Past Medical History  Diagnosis Date  . Chest pain   . Hyperlipidemia   . Premenopausal patient     Conctraception  . Depression   . Ulcerative colitis     Onset at age 48  . Migraine headache     Past Surgical History  Procedure Date  . Colonoscopy 08/2009    normal terminal ileum/inflammatory changes of the rectum and colon with panproctocolitis. No dysplasia on bx.     Family History  Problem Relation Age of Onset  . Uterine cancer Mother   . Ovarian cancer Mother     . Heart attack Brother   . Colon cancer Neg Hx     History   Social History  . Marital Status: Married    Spouse Name: N/A    Number of Children: N/A  . Years of Education: N/A   Occupational History  . [place] History Main Topics  . Smoking status: Never Smoker   . Smokeless tobacco: Not on file  . Alcohol Use: No  . Drug Use: No  . Sexually Active: Not on file   Other Topics Concern  . Not on file   Social History Narrative   Married, no children      ROS:  General: Negative for anorexia, weight loss, fever, chills, fatigue, weakness. Eyes: Negative for vision changes.  ENT: Negative for hoarseness, difficulty swallowing , nasal congestion. CV: Negative for chest pain, angina, palpitations, dyspnea on exertion, peripheral edema.  Respiratory: Negative for dyspnea at rest, dyspnea on exertion, cough, sputum, wheezing.  GI: See history of present illness. GU:  Negative for dysuria, hematuria, urinary incontinence, urinary frequency, nocturnal urination.  MS: Negative for joint pain, low back pain.  Derm: Negative for rash or itching.  Neuro: Negative for weakness, abnormal sensation, seizure,  frequent headaches, memory loss, confusion.  Psych: Negative for anxiety, depression, suicidal ideation, hallucinations.  Endo: Negative for unusual weight change.  Heme: Negative for bruising or bleeding. Allergy: Negative for rash or hives.    Physical Examination:  BP 108/60  Pulse 49  Temp 98.8 F (37.1 C) (Temporal)  Ht 5' 4"  (1.626 m)  Wt 152 lb 6.4 oz (69.128 kg)  BMI 26.16 kg/m2  LMP 05/17/2011   General: Well-nourished, well-developed in no acute distress.  Head: Normocephalic, atraumatic.   Eyes: Conjunctiva pink, no icterus. Mouth: Oropharyngeal mucosa moist and pink , no lesions erythema or exudate. Neck: Supple without thyromegaly, masses, or lymphadenopathy.  Lungs: Clear to auscultation bilaterally.  Heart:  Regular rate and rhythm, no murmurs rubs or gallops.  Abdomen: Bowel sounds are normal, nontender, nondistended, no hepatosplenomegaly or masses, no abdominal bruits or    hernia , no rebound or guarding.   Rectal: defer Extremities: No lower extremity edema. No clubbing or deformities.  Neuro: Alert and oriented x 4 , grossly normal neurologically.  Skin: Warm and dry, no rash or jaundice.   Psych: Alert and cooperative, normal mood and affect.  Labs: Lab Results  Component Value Date   CREATININE 0.71 04/27/2010   BUN 7 04/27/2010   NA 140 04/27/2010   K 4.4 04/27/2010   CL 106 04/27/2010   CO2 21 04/27/2010     Imaging Studies: No results found.

## 2011-09-19 ENCOUNTER — Encounter: Payer: Self-pay | Admitting: Internal Medicine

## 2011-09-20 ENCOUNTER — Encounter (HOSPITAL_COMMUNITY): Payer: Self-pay | Admitting: Internal Medicine

## 2011-09-20 ENCOUNTER — Encounter: Payer: Self-pay | Admitting: *Deleted

## 2012-02-22 ENCOUNTER — Encounter: Payer: Self-pay | Admitting: Internal Medicine

## 2012-02-23 ENCOUNTER — Encounter: Payer: Self-pay | Admitting: Urgent Care

## 2012-02-23 ENCOUNTER — Ambulatory Visit (INDEPENDENT_AMBULATORY_CARE_PROVIDER_SITE_OTHER): Payer: BC Managed Care – PPO | Admitting: Urgent Care

## 2012-02-23 VITALS — BP 107/66 | HR 54 | Temp 97.7°F | Ht 64.0 in | Wt 161.8 lb

## 2012-02-23 DIAGNOSIS — K5190 Ulcerative colitis, unspecified, without complications: Secondary | ICD-10-CM

## 2012-02-23 MED ORDER — BUDESONIDE 9 MG PO TB24: 9.0000 mg | ORAL_TABLET | Freq: Every day | ORAL | Status: DC

## 2012-02-23 MED ORDER — UCERIS 9 MG PO TB24: 9.0000 mg | ORAL_TABLET | Freq: Every day | ORAL | Status: DC

## 2012-02-23 NOTE — Patient Instructions (Addendum)
Begin Uceris 78m daily for 6 weeks Then every other day for 2 weeks Then stop Continue Asacol 2 tabs three times per day If no better, please call Otherwise, Office visit w/ Dr RGala RomneyJuly 2014 Out of work through Friday if needed

## 2012-02-23 NOTE — Progress Notes (Signed)
Faxed to PCP

## 2012-02-23 NOTE — Assessment & Plan Note (Addendum)
Debra Taylor is a pleasant 46 y.o. female with three-week history of ulcerative colitis flare. Colonoscopy is up to date.  Denies NSAIDs, recent antibiotics or medication changes.  Begin Uceris 33m daily for 6 weeks Then every other day for 2 weeks Then stop Continue Asacol 4021m2 three times per day If no better, please call us[place]oreatherwise, Office visit w/ Dr RoGala Romneyuly 2014 Out of work note through Friday given if needed

## 2012-02-23 NOTE — Progress Notes (Signed)
Referring Provider: Gar Ponto, MD Primary Care Physician:  Gar Ponto, MD Primary Gastroenterologist:  Dr. Gala Romney  Chief Complaint  Patient presents with  . Ulcerative Colitis    Flare    HPI:  Debra Taylor is a 46 y.o. female here for work-in for followup pan-ulcerative colitis flare.  She was diagnosed with ulcerative colitis 16 years ago. She cannot remember being on steroids. She doesn't she is in her usual state of health until about 3 weeks ago. Around Thanksgiving her mother came over and had a stomach virus. She developed abdominal cramps and diarrhea shortly after that. She thought she may have the same virus, however her symptoms persisted. She is having 2-4 loose stools daily with small amounts of blood & mucous.  Her abdominal cramps resolved but diarrhea persists.  She denies fever or chills.  She denies nausea and vomiting. Her appetite has been okay. She has tried culturelle probiotics without any difference noted. She's been eating extra yogurt. She denies recent antibiotics or new medications.  Her weight is stable.  She rarely takes Anaprox once every 2 months for migraine, but has not been taking any recent NSAIDs.  She increased her Asacol 254m 2 PO BID to TID to see if that would make a difference.  Last colonoscopy with Dr. RGala Romneywas July 2013 and she had mild patchy pan ulcerative colitis endoscopically as well as on biopsies.  Past Medical History  Diagnosis Date  . Chest pain   . Hyperlipidemia   . Premenopausal patient     Conctraception  . Depression   . Ulcerative colitis     Onset at age 46 . Migraine headache     Past Surgical History  Procedure Date  . Colonoscopy 08/2009    normal terminal ileum/inflammatory changes of the rectum and colon with panproctocolitis. No dysplasia on bx.   . Colonoscopy 09/16/2011    RMR: Findings/Biopsies consistent with chronically mild active pan ulcerative colitis-endoscopically mild appearing. Status post  segmental biopsy    Current Outpatient Prescriptions  Medication Sig Dispense Refill  . mesalamine (ASACOL) 400 MG EC tablet Take 800 mg by mouth 2 (two) times daily.        . naproxen sodium (ANAPROX) 220 MG tablet Take 220 mg by mouth as needed. For pain      . rizatriptan (MAXALT) 10 MG tablet Take 10 mg by mouth as needed. May repeat in 2 hours if needed. For migraine      . UCERIS 9 MG TB24 Take 9 mg by mouth daily. As directed  30 tablet  1    Allergies as of 02/23/2012  . (No Known Allergies)    Review of Systems: Gen: Denies any fever, chills, sweats, anorexia, fatigue, weakness, malaise, weight loss, and sleep disorder CV: Denies chest pain, angina, palpitations, syncope, orthopnea, PND, peripheral edema, and claudication. Resp: Denies dyspnea at rest, dyspnea with exercise, cough, sputum, wheezing, coughing up blood, and pleurisy. GI: Denies vomiting blood, jaundice, and fecal incontinence.    Derm: Denies rash, itching, dry skin, hives, moles, warts, or unhealing ulcers.  Psych: Denies depression, anxiety, memory loss, suicidal ideation, hallucinations, paranoia, and confusion. Heme: Denies bruising or enlarged lymph nodes.  Physical Exam: BP 107/66  Pulse 54  Temp 97.7 F (36.5 C) (Oral)  Ht 5' 4"  (1.626 m)  Wt 161 lb 12.8 oz (73.392 kg)  BMI 27.77 kg/m2 General:   Alert,  Well-developed, well-nourished, pleasant and cooperative in NAD Eyes:  Sclera clear, no  icterus.   Conjunctiva pink. Mouth:  No deformity or lesions, oropharynx pink and moist. Neck:  Supple; no masses or thyromegaly. Heart:  Regular rate and rhythm; no murmurs, clicks, rubs,  or gallops. Abdomen:  Normal bowel sounds.  No bruits.  Soft, non-tender and non-distended without masses, hepatosplenomegaly or hernias noted.  No guarding or rebound tenderness.   Rectal:  Deferred. Msk:  Symmetrical without gross deformities.  Pulses:  Normal pulses noted. Extremities:  No clubbing or  edema. Neurologic:  Alert and oriented x4;  grossly normal neurologically. Skin:  Intact without significant lesions or rashes.

## 2012-05-17 ENCOUNTER — Ambulatory Visit: Payer: BC Managed Care – PPO | Admitting: Gastroenterology

## 2012-08-22 ENCOUNTER — Encounter: Payer: Self-pay | Admitting: Internal Medicine

## 2013-08-08 ENCOUNTER — Encounter: Payer: Self-pay | Admitting: Gastroenterology

## 2013-08-08 ENCOUNTER — Ambulatory Visit (INDEPENDENT_AMBULATORY_CARE_PROVIDER_SITE_OTHER): Payer: BC Managed Care – PPO | Admitting: Gastroenterology

## 2013-08-08 VITALS — BP 109/66 | HR 64 | Temp 97.6°F | Ht 64.0 in | Wt 159.6 lb

## 2013-08-08 DIAGNOSIS — K5190 Ulcerative colitis, unspecified, without complications: Secondary | ICD-10-CM

## 2013-08-08 LAB — BASIC METABOLIC PANEL
BUN: 12 mg/dL (ref 6–23)
CALCIUM: 8.7 mg/dL (ref 8.4–10.5)
CO2: 24 mEq/L (ref 19–32)
Chloride: 107 mEq/L (ref 96–112)
Creat: 0.73 mg/dL (ref 0.50–1.10)
Glucose, Bld: 77 mg/dL (ref 70–99)
POTASSIUM: 3.8 meq/L (ref 3.5–5.3)
SODIUM: 138 meq/L (ref 135–145)

## 2013-08-08 NOTE — Progress Notes (Signed)
Primary Care Physician:  Gar Ponto, MD  Primary Gastroenterologist:  Garfield Cornea, MD   Chief Complaint  Patient presents with  . Colonoscopy    HPI:  Debra Taylor is a 48 y.o. female here to schedule surveillance colonoscopy. She has a history of ulcerative colitis diagnosed 18 years ago. Her last colonoscopy was in July 2013 and she had pan ulcerative colitis at that time. The distal 10 cm of the terminal ileum appear normal. Multiple biopsies showed no evidence of dysplasia. She did have chronically active colitis.  Doing really well. No significant flares since 2013. About one BM daily instead of QOD. No blood in stool. No abdominal pain. Appetite good. Little heartburn, occasional protonix of husbands. About once per week. TUMS occasionally but not very helpful. No dysphagia. No vomiting. Trying to eat better. Down couple of pounds.   Current Outpatient Prescriptions  Medication Sig Dispense Refill  . mesalamine (ASACOL) 400 MG EC tablet Take 800 mg by mouth 2 (two) times daily.        . naproxen sodium (ANAPROX) 220 MG tablet Take 220 mg by mouth as needed. For pain      . rizatriptan (MAXALT) 10 MG tablet Take 10 mg by mouth as needed. May repeat in 2 hours if needed. For migraine      . topiramate (TOPAMAX) 25 MG tablet Take 25 mg by mouth 2 (two) times daily.       . TRI-SPRINTEC 0.18/0.215/0.25 MG-35 MCG tablet Take 1 tablet by mouth daily.        No current facility-administered medications for this visit.    Allergies as of 08/08/2013  . (No Known Allergies)    Past Medical History  Diagnosis Date  . Chest pain   . Hyperlipidemia   . Premenopausal patient     Conctraception  . Depression   . Ulcerative colitis     Onset at age 18  . Migraine headache     Past Surgical History  Procedure Laterality Date  . Colonoscopy  08/2009    normal terminal ileum/inflammatory changes of the rectum and colon with panproctocolitis. No dysplasia on bx.   .  Colonoscopy  09/16/2011    RMR: Findings/Biopsies consistent with chronically mild active pan ulcerative colitis-endoscopically mild appearing. Status post segmental biopsy    Family History  Problem Relation Age of Onset  . Uterine cancer Mother   . Ovarian cancer Mother   . Heart attack Brother   . Colon cancer Neg Hx   . Breast cancer Neg Hx   . Lung cancer Neg Hx     History   Social History  . Marital Status: Married    Spouse Name: N/A    Number of Children: 0  . Years of Education: N/A   Occupational History  . [place] History Main Topics  . Smoking status: Never Smoker   . Smokeless tobacco: Not on file  . Alcohol Use: Yes     Comment: Occasional  . Drug Use: No  . Sexual Activity: Not on file   Other Topics Concern  . Not on file   Social History Narrative   Married, no children      ROS:  General: Negative for anorexia, weight loss, fever, chills, fatigue, weakness. Eyes: Negative for vision changes.  ENT: Negative for hoarseness, difficulty swallowing , nasal congestion. CV: Negative for chest pain, angina, palpitations, dyspnea on exertion, peripheral edema.  Respiratory: Negative for dyspnea  at rest, dyspnea on exertion, cough, sputum, wheezing.  GI: See history of present illness. GU:  Negative for dysuria, hematuria, urinary incontinence, urinary frequency, nocturnal urination.  MS: Negative for joint pain, low back pain.  Derm: Negative for rash or itching.  Neuro: Negative for weakness, abnormal sensation, seizure, frequent headaches, memory loss, confusion.  Psych: Negative for anxiety, depression, suicidal ideation, hallucinations.  Endo: Negative for unusual weight change.  Heme: Negative for bruising or bleeding. Allergy: Negative for rash or hives.    Physical Examination:  BP 109/66  Pulse 64  Temp(Src) 97.6 F (36.4 C) (Oral)  Ht 5' 4"  (1.626 m)  Wt 159 lb 9.6 oz (72.394 kg)  BMI 27.38  kg/m2   General: Well-nourished, well-developed in no acute distress.  Head: Normocephalic, atraumatic.   Eyes: Conjunctiva pink, no icterus. Mouth: Oropharyngeal mucosa moist and pink , no lesions erythema or exudate. Neck: Supple without thyromegaly, masses, or lymphadenopathy.  Lungs: Clear to auscultation bilaterally.  Heart: Regular rate and rhythm, no murmurs rubs or gallops.  Abdomen: Bowel sounds are normal, nontender, nondistended, no hepatosplenomegaly or masses, no abdominal bruits or    hernia , no rebound or guarding.   Rectal: not performed Extremities: No lower extremity edema. No clubbing or deformities.  Neuro: Alert and oriented x 4 , grossly normal neurologically.  Skin: Warm and dry, no rash or jaundice.   Psych: Alert and cooperative, normal mood and affect.

## 2013-08-08 NOTE — Patient Instructions (Signed)
1. Colonoscopy as scheduled.

## 2013-08-08 NOTE — Assessment & Plan Note (Addendum)
48 year old female with a 18+ year history of ulcerative colitis who presents to schedule surveillance colonoscopy. She's been doing very well with no significant flares in the past 2 years. She admits that she takes her morning dose of mesalamine without fail but sometimes forgets her evening dose. Plan for colonoscopy in the near future. She would like to try the standard bowel prep per Dr. Gala Romney recommendation but recalls that she had some issues in the past with vomiting and had to convert to a MiraLax prep. She requested that we provide her with MiraLax prep instructions just in case. She'll call us if she has any difficulty.  I have discussed the risks, alternatives, benefits with regards to but not limited to the risk of reaction to medication, bleeding, infection, perforation and the patient is agreeable to proceed. Written consent to be obtained.  Patient also reports that Dr. Barrie Dunker wants her colon evaluated closely because of her mother's h/o uterine/ovarian cancer at age 24. Suspect he is concerned for possible underlying Lynch syndrome.

## 2013-08-08 NOTE — Progress Notes (Signed)
cc'd to pcp 

## 2013-08-09 NOTE — Progress Notes (Signed)
Quick Note:  Please let patient know her renal function is normal. ______

## 2013-08-20 ENCOUNTER — Encounter (HOSPITAL_COMMUNITY): Payer: Self-pay

## 2013-08-30 ENCOUNTER — Encounter (HOSPITAL_COMMUNITY): Payer: Self-pay | Admitting: *Deleted

## 2013-08-30 ENCOUNTER — Encounter (HOSPITAL_COMMUNITY)
Admission: RE | Disposition: A | Discharge status: Home / Self Care | Payer: Self-pay | Source: Ambulatory Visit | Attending: Internal Medicine

## 2013-08-30 ENCOUNTER — Ambulatory Visit (HOSPITAL_COMMUNITY)
Admission: RE | Admit: 2013-08-30 | Discharge: 2013-08-30 | Disposition: A | Discharge status: Home / Self Care | Payer: BC Managed Care – PPO | Source: Ambulatory Visit | Attending: Internal Medicine | Admitting: Internal Medicine

## 2013-08-30 DIAGNOSIS — F329 Major depressive disorder, single episode, unspecified: Secondary | ICD-10-CM | POA: Insufficient documentation

## 2013-08-30 DIAGNOSIS — K5120 Ulcerative (chronic) proctitis without complications: Secondary | ICD-10-CM | POA: Insufficient documentation

## 2013-08-30 DIAGNOSIS — Z79899 Other long term (current) drug therapy: Secondary | ICD-10-CM | POA: Insufficient documentation

## 2013-08-30 DIAGNOSIS — K5190 Ulcerative colitis, unspecified, without complications: Secondary | ICD-10-CM

## 2013-08-30 DIAGNOSIS — F3289 Other specified depressive episodes: Secondary | ICD-10-CM | POA: Insufficient documentation

## 2013-08-30 DIAGNOSIS — E785 Hyperlipidemia, unspecified: Secondary | ICD-10-CM | POA: Insufficient documentation

## 2013-08-30 HISTORY — PX: COLONOSCOPY: SHX5424

## 2013-08-30 SURGERY — COLONOSCOPY
Anesthesia: Moderate Sedation

## 2013-08-30 MED ORDER — ONDANSETRON HCL 4 MG/2ML IJ SOLN
INTRAMUSCULAR | Status: AC
  Filled 2013-08-30: qty 2

## 2013-08-30 MED ORDER — ONDANSETRON HCL 4 MG/2ML IJ SOLN
INTRAMUSCULAR | Status: DC | PRN
  Administered 2013-08-30: 4 mg via INTRAVENOUS

## 2013-08-30 MED ORDER — MEPERIDINE HCL 100 MG/ML IJ SOLN
INTRAMUSCULAR | Status: AC
  Filled 2013-08-30: qty 2

## 2013-08-30 MED ORDER — MIDAZOLAM HCL 5 MG/5ML IJ SOLN
INTRAMUSCULAR | Status: AC
  Filled 2013-08-30: qty 10

## 2013-08-30 MED ORDER — MEPERIDINE HCL 100 MG/ML IJ SOLN
INTRAMUSCULAR | Status: DC | PRN
  Administered 2013-08-30 (×2): 50 mg via INTRAVENOUS
  Administered 2013-08-30: 25 mg via INTRAVENOUS

## 2013-08-30 MED ORDER — SIMETHICONE 40 MG/0.6ML PO SUSP
ORAL | Status: DC | PRN
  Administered 2013-08-30: 09:00:00

## 2013-08-30 MED ORDER — MIDAZOLAM HCL 5 MG/5ML IJ SOLN
INTRAMUSCULAR | Status: DC | PRN
Start: 2013-08-30 — End: 2013-08-30
  Administered 2013-08-30: 1 mg via INTRAVENOUS
  Administered 2013-08-30 (×3): 2 mg via INTRAVENOUS
  Administered 2013-08-30: 1 mg via INTRAVENOUS

## 2013-08-30 MED ORDER — SODIUM CHLORIDE 0.9 % IV SOLN
INTRAVENOUS | Status: DC
  Administered 2013-08-30: 08:00:00 via INTRAVENOUS

## 2013-08-30 NOTE — Interval H&P Note (Signed)
History and Physical Interval Note:  08/30/2013 8:32 AM  Debra Taylor  has presented today for surgery, with the diagnosis of ULCERATIVE COLITIS  The various methods of treatment have been discussed with the patient and family. After consideration of risks, benefits and other options for treatment, the patient has consented to  Procedure(s) with comments: COLONOSCOPY (N/A) - 800-moved to [place] to notify pt as a surgical intervention .  The patient's history has been reviewed, patient examined, no change in status, stable for surgery.  I have reviewed the patient's chart and labs.  Questions were answered to the patient's satisfaction.    No change. Colonoscopy per plan.The risks, benefits, limitations, alternatives and imponderables have been reviewed with the patient. Questions have been answered. All parties are agreeable.   Manus Rudd

## 2013-08-30 NOTE — H&P (View-Only) (Signed)
Primary Care Physician:  Gar Ponto, MD  Primary Gastroenterologist:  Garfield Cornea, MD   Chief Complaint  Patient presents with  . Colonoscopy    HPI:  ANNYA LIZANA is a 48 y.o. female here to schedule surveillance colonoscopy. She has a history of ulcerative colitis diagnosed 18 years ago. Her last colonoscopy was in July 2013 and she had pan ulcerative colitis at that time. The distal 10 cm of the terminal ileum appear normal. Multiple biopsies showed no evidence of dysplasia. She did have chronically active colitis.  Doing really well. No significant flares since 2013. About one BM daily instead of QOD. No blood in stool. No abdominal pain. Appetite good. Little heartburn, occasional protonix of husbands. About once per week. TUMS occasionally but not very helpful. No dysphagia. No vomiting. Trying to eat better. Down couple of pounds.   Current Outpatient Prescriptions  Medication Sig Dispense Refill  . mesalamine (ASACOL) 400 MG EC tablet Take 800 mg by mouth 2 (two) times daily.        . naproxen sodium (ANAPROX) 220 MG tablet Take 220 mg by mouth as needed. For pain      . rizatriptan (MAXALT) 10 MG tablet Take 10 mg by mouth as needed. May repeat in 2 hours if needed. For migraine      . topiramate (TOPAMAX) 25 MG tablet Take 25 mg by mouth 2 (two) times daily.       . TRI-SPRINTEC 0.18/0.215/0.25 MG-35 MCG tablet Take 1 tablet by mouth daily.        No current facility-administered medications for this visit.    Allergies as of 08/08/2013  . (No Known Allergies)    Past Medical History  Diagnosis Date  . Chest pain   . Hyperlipidemia   . Premenopausal patient     Conctraception  . Depression   . Ulcerative colitis     Onset at age 5  . Migraine headache     Past Surgical History  Procedure Laterality Date  . Colonoscopy  08/2009    normal terminal ileum/inflammatory changes of the rectum and colon with panproctocolitis. No dysplasia on bx.   .  Colonoscopy  09/16/2011    RMR: Findings/Biopsies consistent with chronically mild active pan ulcerative colitis-endoscopically mild appearing. Status post segmental biopsy    Family History  Problem Relation Age of Onset  . Uterine cancer Mother   . Ovarian cancer Mother   . Heart attack Brother   . Colon cancer Neg Hx   . Breast cancer Neg Hx   . Lung cancer Neg Hx     History   Social History  . Marital Status: Married    Spouse Name: N/A    Number of Children: 0  . Years of Education: N/A   Occupational History  . [place] History Main Topics  . Smoking status: Never Smoker   . Smokeless tobacco: Not on file  . Alcohol Use: Yes     Comment: Occasional  . Drug Use: No  . Sexual Activity: Not on file   Other Topics Concern  . Not on file   Social History Narrative   Married, no children      ROS:  General: Negative for anorexia, weight loss, fever, chills, fatigue, weakness. Eyes: Negative for vision changes.  ENT: Negative for hoarseness, difficulty swallowing , nasal congestion. CV: Negative for chest pain, angina, palpitations, dyspnea on exertion, peripheral edema.  Respiratory: Negative for dyspnea  at rest, dyspnea on exertion, cough, sputum, wheezing.  GI: See history of present illness. GU:  Negative for dysuria, hematuria, urinary incontinence, urinary frequency, nocturnal urination.  MS: Negative for joint pain, low back pain.  Derm: Negative for rash or itching.  Neuro: Negative for weakness, abnormal sensation, seizure, frequent headaches, memory loss, confusion.  Psych: Negative for anxiety, depression, suicidal ideation, hallucinations.  Endo: Negative for unusual weight change.  Heme: Negative for bruising or bleeding. Allergy: Negative for rash or hives.    Physical Examination:  BP 109/66  Pulse 64  Temp(Src) 97.6 F (36.4 C) (Oral)  Ht 5' 4"  (1.626 m)  Wt 159 lb 9.6 oz (72.394 kg)  BMI 27.38  kg/m2   General: Well-nourished, well-developed in no acute distress.  Head: Normocephalic, atraumatic.   Eyes: Conjunctiva pink, no icterus. Mouth: Oropharyngeal mucosa moist and pink , no lesions erythema or exudate. Neck: Supple without thyromegaly, masses, or lymphadenopathy.  Lungs: Clear to auscultation bilaterally.  Heart: Regular rate and rhythm, no murmurs rubs or gallops.  Abdomen: Bowel sounds are normal, nontender, nondistended, no hepatosplenomegaly or masses, no abdominal bruits or    hernia , no rebound or guarding.   Rectal: not performed Extremities: No lower extremity edema. No clubbing or deformities.  Neuro: Alert and oriented x 4 , grossly normal neurologically.  Skin: Warm and dry, no rash or jaundice.   Psych: Alert and cooperative, normal mood and affect.

## 2013-08-30 NOTE — Discharge Instructions (Signed)
°  Colonoscopy Discharge Instructions  Read the instructions outlined below and refer to this sheet in the next few weeks. These discharge instructions provide you with general information on caring for yourself after you leave the hospital. Your doctor may also give you specific instructions. While your treatment has been planned according to the most current medical practices available, unavoidable complications occasionally occur. If you have any problems or questions after discharge, call Dr. Gala Romney at [phone]. ACTIVITY  You may resume your regular activity, but move at a slower pace for the next 24 hours.   Take frequent rest periods for the next 24 hours.   Walking will help get rid of the air and reduce the bloated feeling in your belly (abdomen).   No driving for 24 hours (because of the medicine (anesthesia) used during the test).    Do not sign any important legal documents or operate any machinery for 24 hours (because of the anesthesia used during the test).  NUTRITION  Drink plenty of fluids.   You may resume your normal diet as instructed by your doctor.   Begin with a light meal and progress to your normal diet. Heavy or fried foods are harder to digest and may make you feel sick to your stomach (nauseated).   Avoid alcoholic beverages for 24 hours or as instructed.  MEDICATIONS  You may resume your normal medications unless your doctor tells you otherwise.  WHAT YOU CAN EXPECT TODAY  Some feelings of bloating in the abdomen.   Passage of more gas than usual.   Spotting of blood in your stool or on the toilet paper.  IF YOU HAD POLYPS REMOVED DURING THE COLONOSCOPY:  No aspirin products for 7 days or as instructed.   No alcohol for 7 days or as instructed.   Eat a soft diet for the next 24 hours.  FINDING OUT THE RESULTS OF YOUR TEST Not all test results are available during your visit. If your test results are not back during the visit, make an appointment  with your caregiver to find out the results. Do not assume everything is normal if you have not heard from your caregiver or the medical facility. It is important for you to follow up on all of your test results.  SEEK IMMEDIATE MEDICAL ATTENTION IF:  You have more than a spotting of blood in your stool.   Your belly is swollen (abdominal distention).   You are nauseated or vomiting.   You have a temperature over 101.   You have abdominal pain or discomfort that is severe or gets worse throughout the day.     Continue Asacol  It would be best to avoid Naprosyn entirely as this type of medication can exacerbate ulcerative colitis  Further recommendations to follow pending review of pathology report

## 2013-08-30 NOTE — Op Note (Signed)
[place], [id number]   COLONOSCOPY PROCEDURE REPORT  PATIENT: Debra Taylor, Debra Taylor  MR#:         [id number] BIRTHDATE: 04-Aug-1965 , 48  yrs. old GENDER: Female ENDOSCOPIST: R.  Garfield Cornea, MD FACP [facility] REFERRED BY:  Gar Ponto, M.D. Dr. Barrie Dunker PROCEDURE DATE:  08/30/2013 PROCEDURE:     Ileocolonoscopy with segmental biopsy  INDICATIONS: 20 year history of panulcerative colitis; surveillance examination. Clinically in remission on Asacol; continues to use Naprosyn. serum creatinine normal.  INFORMED CONSENT:  The risks, benefits, alternatives and imponderables including but not limited to bleeding, perforation as well as the possibility of a missed lesion have been reviewed.  The potential for biopsy, lesion removal, etc. have also been discussed.  Questions have been answered.  All parties agreeable. Please see the history and physical in the medical record for more information.  MEDICATIONS: Versed 8 mg IV and Demerol 125 mg IV in divided doses. Zofran 4 mg IV.  DESCRIPTION OF PROCEDURE:  After a digital rectal exam was performed, the EC-3890Li ([id number]) and EC-3890Li ([id number]) colonoscope was advanced from the anus through the rectum and colon to the area of the cecum, ileocecal valve and appendiceal orifice. The cecum was deeply intubated.  These structures were well-seen and photographed for the record.  From the level of the cecum and ileocecal valve, the scope was slowly and cautiously withdrawn. The mucosal surfaces were carefully surveyed utilizing scope tip deflection to facilitate fold flattening as needed.  The scope was pulled down into the rectum where a thorough examination was performed.    FINDINGS:  Adequate preparation. Rectal mucosa diffusely abnormal with granularity/friability and loss of the normal vascular pattern . No erosion, ulceration or focal mass lesion. Rectal vault small and with inflammation  present, I elected not to attempt retroflexion. Rectal mucosa seen  very well on-face. Inflammatory changes in the rectum extended into the distal colon but tapered off through the mid descending segment. Minimal, subtle thickening of the colonic mucosa seen all the way to the cecum. No polyp or a focal mass lesion observed. The distal 10 cm of terminal ileal mucosa appeared normal.  THERAPEUTIC / DIAGNOSTIC MANEUVERS PERFORMED:  multiple segmental biopsies of the ascending, transverse, descending, sigmoid and rectal segments taken circumferentially every 5-10 cm taken to assess for dysplasia.  COMPLICATIONS: none  CECAL WITHDRAWAL TIME:  17 minutes  IMPRESSION:  Abnormal rectum and colon diffusely with endoscopically active distal proctocolitis as described above. Status post segmental biopsy  RECOMMENDATIONS: Clinically, in remission. She is to continue Asacol. She has been repeatedly admonished to avoid nonsteroidal agents as this class of medication can exacerbate colitis.  Further recommendations to follow pending review of pathology report  _______________________________ eSigned:  R. Garfield Cornea, MD FACP [facility] 08/30/2013 9:33 AM   CC:    PATIENT NAME:  Aamna, Mallozzi MR#: [id number]

## 2013-08-31 ENCOUNTER — Encounter (HOSPITAL_COMMUNITY): Payer: Self-pay | Admitting: Internal Medicine

## 2013-09-02 ENCOUNTER — Encounter: Payer: Self-pay | Admitting: Internal Medicine

## 2015-07-21 ENCOUNTER — Encounter: Payer: Self-pay | Admitting: Internal Medicine

## 2015-08-19 ENCOUNTER — Encounter: Payer: Self-pay | Admitting: Gastroenterology

## 2015-08-19 ENCOUNTER — Telehealth: Payer: Self-pay | Admitting: Internal Medicine

## 2015-08-19 ENCOUNTER — Ambulatory Visit (INDEPENDENT_AMBULATORY_CARE_PROVIDER_SITE_OTHER): Payer: BLUE CROSS/BLUE SHIELD | Admitting: Gastroenterology

## 2015-08-19 ENCOUNTER — Other Ambulatory Visit: Payer: Self-pay

## 2015-08-19 VITALS — BP 112/85 | HR 67 | Temp 97.6°F | Ht 64.5 in | Wt 163.0 lb

## 2015-08-19 DIAGNOSIS — K5100 Ulcerative (chronic) pancolitis without complications: Secondary | ICD-10-CM | POA: Diagnosis not present

## 2015-08-19 DIAGNOSIS — K5190 Ulcerative colitis, unspecified, without complications: Secondary | ICD-10-CM

## 2015-08-19 NOTE — Telephone Encounter (Signed)
Called pt and is aware

## 2015-08-19 NOTE — Telephone Encounter (Signed)
Should not cause any issues. Proceed with TCS as scheduled.

## 2015-08-19 NOTE — Telephone Encounter (Signed)
Pt was seen today by LSL and scheduled for a colonoscopy next Thursday. She called this afternoon to say that she forgot to tell us that she was under deep sedation anesthesia 3 weeks ago and didn't know if this would cause a problem with having her procedure next week. If we need to call her dentist it's Dr Roland Earl at [phone]. Please advise and call patient back

## 2015-08-19 NOTE — Patient Instructions (Signed)
1. Colonoscopy as scheduled. Please see separate instructions. 2. Please take at minimum the maintenance dose for ulcerative colitis. Delzicol two twice a day (821m twice per day).

## 2015-08-19 NOTE — Progress Notes (Addendum)
Primary Care Physician:  Gar Ponto, MD  Primary Gastroenterologist:  Garfield Cornea, MD  Chief Complaint  Patient presents with  . Colonoscopy    HPI:  Debra Taylor is a 50 y.o. female with history of ulcerative colitis diagnosed 20+ years ago who presents for surveillance colonoscopy. Last colonoscopy was in June 2015. Endoscopically she had active distal proctocolitis with mildly active colitis noted on biopsies. No evidence of dysplasia.  Clinically she reports that she is doing fairly well. She generally has a couple bowel movements daily. No significant flares. No blood in the stool. No abdominal pain. Appetite is good. She admits to taking Delzicol 800 mg once daily for the most part and occasionally twice daily. Has a hard time swallowing the large pills and admits this usually causes her to avoid the second dose. I educated her today that she can actually open the capsules and swallowed the interior tablets by themselves as long she doesn't chew or crush them. Denies any heartburn.  Current Outpatient Prescriptions  Medication Sig Dispense Refill  . DELZICOL 400 MG CPDR DR capsule Take 2 capsules by mouth daily.    Marland Kitchen doxycycline (VIBRAMYCIN) 50 MG capsule Take 50 mg by mouth daily.     . naproxen sodium (ANAPROX) 220 MG tablet Take 440 mg by mouth daily as needed (pain).    . norgestimate-ethinyl estradiol (ORTHO-CYCLEN,SPRINTEC,PREVIFEM) 0.25-35 MG-MCG tablet Take 1 tablet by mouth daily.    . rizatriptan (MAXALT-MLT) 10 MG disintegrating tablet Take 10 mg by mouth as needed for migraine. May repeat in 2 hours if needed    . topiramate (TOPAMAX) 100 MG tablet Take 100 mg by mouth 2 (two) times daily.     No current facility-administered medications for this visit.    Allergies as of 08/19/2015  . (No Known Allergies)    Past Medical History  Diagnosis Date  . Chest pain   . Hyperlipidemia   . Premenopausal patient     Conctraception  . Depression   . Ulcerative  colitis     Onset at age 21  . Migraine headache     Past Surgical History  Procedure Laterality Date  . Colonoscopy  08/2009    normal terminal ileum/inflammatory changes of the rectum and colon with panproctocolitis. No dysplasia on bx.   . Colonoscopy  09/16/2011    RMR: Findings/Biopsies consistent with chronically mild active pan ulcerative colitis-endoscopically mild appearing. Status post segmental biopsy  . Colonoscopy N/A 08/30/2013    RMR: Endoscopically active distal proctocolitis, comparable biopsies. No dysplasia.    Family History  Problem Relation Age of Onset  . Uterine cancer Mother   . Ovarian cancer Mother   . Heart attack Brother   . Colon cancer Neg Hx   . Breast cancer Neg Hx   . Lung cancer Neg Hx     Social History   Social History  . Marital Status: Married    Spouse Name: N/A  . Number of Children: 0  . Years of Education: N/A   Occupational History  . [place] History Main Topics  . Smoking status: Never Smoker   . Smokeless tobacco: Not on file  . Alcohol Use: Yes     Comment: Occasional  . Drug Use: No  . Sexual Activity: Not on file   Other Topics Concern  . Not on file   Social History Narrative   Married, no children      ROS:  General: Negative for anorexia, weight loss, fever, chills, fatigue, weakness. Eyes: Negative for vision changes.  ENT: Negative for hoarseness, difficulty swallowing , nasal congestion. CV: Negative for chest pain, angina, palpitations, dyspnea on exertion, peripheral edema.  Respiratory: Negative for dyspnea at rest, dyspnea on exertion, cough, sputum, wheezing.  GI: See history of present illness. GU:  Negative for dysuria, hematuria, urinary incontinence, urinary frequency, nocturnal urination.  MS: Negative for joint pain, low back pain.  Derm: Negative for rash or itching.  Neuro: Negative for weakness, abnormal sensation, seizure, frequent headaches,  memory loss, confusion.  Psych: Negative for anxiety, depression, suicidal ideation, hallucinations.  Endo: Negative for unusual weight change.  Heme: Negative for bruising or bleeding. Allergy: Negative for rash or hives.    Physical Examination:  BP 112/85 mmHg  Pulse 67  Temp(Src) 97.6 F (36.4 C) (Oral)  Ht 5' 4.5" (1.638 m)  Wt 163 lb (73.936 kg)  BMI 27.56 kg/m2   General: Well-nourished, well-developed in no acute distress.  Head: Normocephalic, atraumatic.   Eyes: Conjunctiva pink, no icterus. Mouth: Oropharyngeal mucosa moist and pink , no lesions erythema or exudate. Neck: Supple without thyromegaly, masses, or lymphadenopathy.  Lungs: Clear to auscultation bilaterally.  Heart: Regular rate and rhythm, no murmurs rubs or gallops.  Abdomen: Bowel sounds are normal, nontender, nondistended, no hepatosplenomegaly or masses, no abdominal bruits or    hernia , no rebound or guarding.   Rectal: deferred Extremities: No lower extremity edema. No clubbing or deformities.  Neuro: Alert and oriented x 4 , grossly normal neurologically.  Skin: Warm and dry, no rash or jaundice.   Psych: Alert and cooperative, normal mood and affect.

## 2015-08-21 ENCOUNTER — Encounter: Payer: Self-pay | Admitting: Gastroenterology

## 2015-08-21 NOTE — Progress Notes (Signed)
Please let the patient know that she is due for LFTs, met 7 (because of chronic UC and mesalamine therapy) unless she has had that recently within the past 6 months with another provider.

## 2015-08-21 NOTE — Progress Notes (Signed)
cc'ed to pcp °

## 2015-08-21 NOTE — Assessment & Plan Note (Signed)
50 year old female with 20+ year history of ulcerative colitis due for surveillance colonoscopy. Clinically doing well. Encouraged her to take at least maintenance dose of Delzicol 800 mg twice a day.  I have discussed the risks, alternatives, benefits with regards to but not limited to the risk of reaction to medication, bleeding, infection, perforation and the patient is agreeable to proceed. Written consent to be obtained.  She is due to have renal function/lfts checked.

## 2015-08-26 ENCOUNTER — Telehealth: Payer: Self-pay | Admitting: Internal Medicine

## 2015-08-26 NOTE — Telephone Encounter (Signed)
Talked with patient and answered questions

## 2015-08-26 NOTE — Telephone Encounter (Signed)
Pt has procedure scheduled for Thursday with RMR and wanted to know if she could take her topamax that morning for her migraines. Please advise and call [phone]

## 2015-08-28 ENCOUNTER — Encounter (HOSPITAL_COMMUNITY)
Admission: RE | Disposition: A | Discharge status: Home / Self Care | Payer: Self-pay | Source: Ambulatory Visit | Attending: Internal Medicine

## 2015-08-28 ENCOUNTER — Encounter (HOSPITAL_COMMUNITY): Payer: Self-pay | Admitting: *Deleted

## 2015-08-28 ENCOUNTER — Ambulatory Visit (HOSPITAL_COMMUNITY)
Admission: RE | Admit: 2015-08-28 | Discharge: 2015-08-28 | Disposition: A | Discharge status: Home / Self Care | Payer: BLUE CROSS/BLUE SHIELD | Source: Ambulatory Visit | Attending: Internal Medicine | Admitting: Internal Medicine

## 2015-08-28 DIAGNOSIS — Z79899 Other long term (current) drug therapy: Secondary | ICD-10-CM | POA: Diagnosis not present

## 2015-08-28 DIAGNOSIS — F329 Major depressive disorder, single episode, unspecified: Secondary | ICD-10-CM | POA: Diagnosis not present

## 2015-08-28 DIAGNOSIS — K5190 Ulcerative colitis, unspecified, without complications: Secondary | ICD-10-CM | POA: Diagnosis not present

## 2015-08-28 DIAGNOSIS — Z808 Family history of malignant neoplasm of other organs or systems: Secondary | ICD-10-CM | POA: Insufficient documentation

## 2015-08-28 DIAGNOSIS — E785 Hyperlipidemia, unspecified: Secondary | ICD-10-CM | POA: Insufficient documentation

## 2015-08-28 DIAGNOSIS — Z1211 Encounter for screening for malignant neoplasm of colon: Secondary | ICD-10-CM | POA: Diagnosis not present

## 2015-08-28 DIAGNOSIS — Z8041 Family history of malignant neoplasm of ovary: Secondary | ICD-10-CM | POA: Diagnosis not present

## 2015-08-28 HISTORY — PX: COLONOSCOPY: SHX5424

## 2015-08-28 LAB — BASIC METABOLIC PANEL
ANION GAP: 4 — AB (ref 5–15)
BUN: 11 mg/dL (ref 6–20)
CHLORIDE: 112 mmol/L — AB (ref 101–111)
CO2: 20 mmol/L — AB (ref 22–32)
Calcium: 7.6 mg/dL — ABNORMAL LOW (ref 8.9–10.3)
Creatinine, Ser: 0.77 mg/dL (ref 0.44–1.00)
GFR calc Af Amer: >60 mL/min (ref >60)
GLUCOSE: 79 mg/dL (ref 65–99)
POTASSIUM: 3.3 mmol/L — AB (ref 3.5–5.1)
Sodium: 136 mmol/L (ref 135–145)

## 2015-08-28 LAB — HEPATIC FUNCTION PANEL
ALBUMIN: 3.2 g/dL — AB (ref 3.5–5.0)
ALT: 13 U/L — AB (ref 14–54)
AST: 16 U/L (ref 15–41)
Alkaline Phosphatase: 35 U/L — ABNORMAL LOW (ref 38–126)
Bilirubin, Direct: <0.1 mg/dL — ABNORMAL LOW (ref 0.1–0.5)
TOTAL PROTEIN: 6.4 g/dL — AB (ref 6.5–8.1)
Total Bilirubin: 0.6 mg/dL (ref 0.3–1.2)

## 2015-08-28 SURGERY — COLONOSCOPY
Anesthesia: Moderate Sedation

## 2015-08-28 MED ORDER — SODIUM CHLORIDE 0.9 % IV SOLN
INTRAVENOUS | Status: DC
  Administered 2015-08-28: 13:00:00 via INTRAVENOUS

## 2015-08-28 MED ORDER — MIDAZOLAM HCL 5 MG/5ML IJ SOLN
INTRAMUSCULAR | Status: AC
  Filled 2015-08-28: qty 10

## 2015-08-28 MED ORDER — ONDANSETRON HCL 4 MG/2ML IJ SOLN
INTRAMUSCULAR | Status: DC | PRN
  Administered 2015-08-28: 4 mg via INTRAVENOUS

## 2015-08-28 MED ORDER — PROMETHAZINE HCL 25 MG/ML IJ SOLN
INTRAMUSCULAR | Status: AC
  Filled 2015-08-28: qty 1

## 2015-08-28 MED ORDER — PROMETHAZINE HCL 25 MG/ML IJ SOLN
25.0000 mg | Freq: Once | INTRAMUSCULAR | Status: AC
  Administered 2015-08-28: 25 mg via INTRAVENOUS

## 2015-08-28 MED ORDER — ONDANSETRON HCL 4 MG/2ML IJ SOLN
INTRAMUSCULAR | Status: AC
  Filled 2015-08-28: qty 2

## 2015-08-28 MED ORDER — MEPERIDINE HCL 100 MG/ML IJ SOLN
INTRAMUSCULAR | Status: AC
  Filled 2015-08-28: qty 2

## 2015-08-28 MED ORDER — STERILE WATER FOR IRRIGATION IR SOLN
Status: DC | PRN
  Administered 2015-08-28: 13:00:00

## 2015-08-28 MED ORDER — MIDAZOLAM HCL 5 MG/5ML IJ SOLN
INTRAMUSCULAR | Status: DC | PRN
  Administered 2015-08-28 (×2): 1 mg via INTRAVENOUS
  Administered 2015-08-28 (×2): 2 mg via INTRAVENOUS

## 2015-08-28 MED ORDER — MEPERIDINE HCL 100 MG/ML IJ SOLN
INTRAMUSCULAR | Status: DC | PRN
  Administered 2015-08-28: 25 mg via INTRAVENOUS
  Administered 2015-08-28 (×2): 50 mg via INTRAVENOUS

## 2015-08-28 MED ORDER — SODIUM CHLORIDE 0.9% FLUSH
INTRAVENOUS | Status: AC
  Filled 2015-08-28: qty 10

## 2015-08-28 NOTE — Discharge Instructions (Signed)
°  Colonoscopy Discharge Instructions  Read the instructions outlined below and refer to this sheet in the next few weeks. These discharge instructions provide you with general information on caring for yourself after you leave the hospital. Your doctor may also give you specific instructions. While your treatment has been planned according to the most current medical practices available, unavoidable complications occasionally occur. If you have any problems or questions after discharge, call Dr. Gala Romney at [phone]. ACTIVITY  You may resume your regular activity, but move at a slower pace for the next 24 hours.   Take frequent rest periods for the next 24 hours.   Walking will help get rid of the air and reduce the bloated feeling in your belly (abdomen).   No driving for 24 hours (because of the medicine (anesthesia) used during the test).    Do not sign any important legal documents or operate any machinery for 24 hours (because of the anesthesia used during the test).  NUTRITION  Drink plenty of fluids.   You may resume your normal diet as instructed by your doctor.   Begin with a light meal and progress to your normal diet. Heavy or fried foods are harder to digest and may make you feel sick to your stomach (nauseated).   Avoid alcoholic beverages for 24 hours or as instructed.  MEDICATIONS  You may resume your normal medications unless your doctor tells you otherwise.  WHAT YOU CAN EXPECT TODAY  Some feelings of bloating in the abdomen.   Passage of more gas than usual.   Spotting of blood in your stool or on the toilet paper.  IF YOU HAD POLYPS REMOVED DURING THE COLONOSCOPY:  No aspirin products for 7 days or as instructed.   No alcohol for 7 days or as instructed.   Eat a soft diet for the next 24 hours.  FINDING OUT THE RESULTS OF YOUR TEST Not all test results are available during your visit. If your test results are not back during the visit, make an appointment  with your caregiver to find out the results. Do not assume everything is normal if you have not heard from your caregiver or the medical facility. It is important for you to follow up on all of your test results.  SEEK IMMEDIATE MEDICAL ATTENTION IF:  You have more than a spotting of blood in your stool.   Your belly is swollen (abdominal distention).   You are nauseated or vomiting.   You have a temperature over 101.   You have abdominal pain or discomfort that is severe or gets worse throughout the day.    Limit use of nonsteroidal drugs like Naprosyn in the setting of ulcerative colitis  Further recommendations to follow pending review of pathology report  Hepatic function profile and basic metabolic profile today

## 2015-08-28 NOTE — Op Note (Signed)
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 Gwenlyn Perking RN, RN,                            Randa Spike, Technician Referring Debra Taylor:              Medicines:                Midazolam 6 mg IV, Meperidine 125 mg IV,                            Ondansetron 4 mg IV Complications:            No immediate complications. Estimated Blood Loss:     Estimated blood loss was minimal. Procedure:                Pre-Anesthesia Assessment:                           - Prior to the procedure, a History and Physical                            was performed, and patient medications and                            allergies were reviewed. The patient's tolerance of                            previous anesthesia was also reviewed. The risks                            and benefits of the procedure and the sedation                            options and risks were discussed with the patient.                            All questions were answered, and informed consent                            was obtained. Prior Anticoagulants: The patient has                            taken no previous anticoagulant or antiplatelet  agents. After reviewing the risks and benefits, the                            patient was deemed in satisfactory condition to                            undergo the procedure.                           After obtaining informed consent, the colonoscope                            was passed under direct vision. Throughout the                            procedure, the  patient's blood pressure, pulse, and                            oxygen saturations were monitored continuously. The                            Colonoscope was introduced through the anus and                            advanced to the the cecum, identified by                            appendiceal orifice and ileocecal valve. The                            colonoscopy was performed without difficulty. The                            patient tolerated the procedure well. The quality                            of the bowel preparation was adequate. The                            ileocecal valve, appendiceal orifice, and rectum                            were photographed. The ileocecal valve, appendiceal                            orifice, and rectum were photographed. Scope In: 1:18:59 PM Scope Out: 1:34:20 PM Scope Withdrawal Time: 0 hours 10 minutes 18 seconds  Total Procedure Duration: 0 hours 15 minutes 21 seconds  Findings:      The perianal and digital rectal examinations were normal.      Inflammation characterized by altered vascularity, congestion (edema),       erosions, erythema, friability and granularity was found in a continuous       and circumferential pattern from the descending colon to the splenic       flexure. This  was mild in severity, and when compared to previous       examinations, the findings are unchanged. the more proximal colon to the       cecum had very minimal similar changes. There are no focal lesions       including polyps, etc. Frequent, segmental biopsies were taken with cold       forceps for histology. Estimated blood loss: none.      The exam was otherwise without abnormality on direct and retroflexion       views. Impression:               - Inflammation was found from the descending colon                            to the splenic flexure. This was mild in severity.                            The findings are unchanged compared to previous                             examinations. Biopsied.                           - The examination was otherwise normal on direct                            and retroflexion views. Moderate Sedation:      Moderate (conscious) sedation was administered by the endoscopy nurse       and supervised by the endoscopist. The following parameters were       monitored: oxygen saturation, heart rate, blood pressure, respiratory       rate, EKG, adequacy of pulmonary ventilation, and response to care.       Total physician intraservice time was 35 minutes. Recommendation:           - Patient has a contact number available for                            emergencies. The signs and symptoms of potential                            delayed complications were discussed with the                            patient. Return to normal activities tomorrow.                            Written discharge instructions were provided to the                            patient.                           - Advance diet as tolerated.                           - Continue present medications.                           -  minimize use of aspirin, ibuprofen, naproxen, or                            other non-steroidal anti-inflammatory drugs as much                            as possible.                           - Repeat colonoscopy date to be determined after                            pending pathology results are reviewed for                            surveillance based on pathology results. Procedure Code(s):        --- Professional ---                           [phone], Colonoscopy, flexible; with biopsy, single                            or multiple                           99152, Moderate sedation services provided by the                            same physician or other qualified health care                            professional performing the diagnostic or                            therapeutic service that the sedation  supports,                            requiring the presence of an independent trained                            observer to assist in the monitoring of the                            patient's level of consciousness and physiological                            status; initial 15 minutes of intraservice time,                            patient age 22 years or older                           [phone], Moderate sedation services; each additional  15 minutes intraservice time Diagnosis Code(s):        --- Professional ---                           K51.00, Ulcerative (chronic) pancolitis without                            complications                           K52.9, Noninfective gastroenteritis and colitis,                            unspecified CPT copyright 2016 American Medical Association. All rights reserved. The codes documented in this report are preliminary and upon coder review may  be revised to meet current compliance requirements. Debra Taylor. Debra Mellette, Debra Taylor Debra Richards, Debra Taylor 08/28/2015 1:48:06 PM This report has been signed electronically. Number of Addenda: 0

## 2015-08-28 NOTE — H&P (View-Only) (Signed)
Primary Care Physician:  Gar Ponto, MD  Primary Gastroenterologist:  Garfield Cornea, MD  Chief Complaint  Patient presents with  . Colonoscopy    HPI:  Debra Taylor is a 50 y.o. female with history of ulcerative colitis diagnosed 20+ years ago who presents for surveillance colonoscopy. Last colonoscopy was in June 2015. Endoscopically she had active distal proctocolitis with mildly active colitis noted on biopsies. No evidence of dysplasia.  Clinically she reports that she is doing fairly well. She generally has a couple bowel movements daily. No significant flares. No blood in the stool. No abdominal pain. Appetite is good. She admits to taking nasal call 800 mg once daily for the most part and occasionally twice daily. Has a hard time swallowing the large Stools and admits this usually causes her to avoid the second dose. I educated her today that she can actually open the capsules and swallowed the interior tablets by themselves as long she doesn't chew or crush them. Denies any heartburn.  Current Outpatient Prescriptions  Medication Sig Dispense Refill  . DELZICOL 400 MG CPDR DR capsule Take 2 capsules by mouth daily.    Marland Kitchen doxycycline (VIBRAMYCIN) 50 MG capsule Take 50 mg by mouth daily.     . naproxen sodium (ANAPROX) 220 MG tablet Take 440 mg by mouth daily as needed (pain).    . norgestimate-ethinyl estradiol (ORTHO-CYCLEN,SPRINTEC,PREVIFEM) 0.25-35 MG-MCG tablet Take 1 tablet by mouth daily.    . rizatriptan (MAXALT-MLT) 10 MG disintegrating tablet Take 10 mg by mouth as needed for migraine. May repeat in 2 hours if needed    . topiramate (TOPAMAX) 100 MG tablet Take 100 mg by mouth 2 (two) times daily.     No current facility-administered medications for this visit.    Allergies as of 08/19/2015  . (No Known Allergies)    Past Medical History  Diagnosis Date  . Chest pain   . Hyperlipidemia   . Premenopausal patient     Conctraception  . Depression   . Ulcerative  colitis     Onset at age 74  . Migraine headache     Past Surgical History  Procedure Laterality Date  . Colonoscopy  08/2009    normal terminal ileum/inflammatory changes of the rectum and colon with panproctocolitis. No dysplasia on bx.   . Colonoscopy  09/16/2011    RMR: Findings/Biopsies consistent with chronically mild active pan ulcerative colitis-endoscopically mild appearing. Status post segmental biopsy  . Colonoscopy N/A 08/30/2013    RMR: Endoscopically active distal proctocolitis, comparable biopsies. No dysplasia.    Family History  Problem Relation Age of Onset  . Uterine cancer Mother   . Ovarian cancer Mother   . Heart attack Brother   . Colon cancer Neg Hx   . Breast cancer Neg Hx   . Lung cancer Neg Hx     Social History   Social History  . Marital Status: Married    Spouse Name: N/A  . Number of Children: 0  . Years of Education: N/A   Occupational History  . [place] History Main Topics  . Smoking status: Never Smoker   . Smokeless tobacco: Not on file  . Alcohol Use: Yes     Comment: Occasional  . Drug Use: No  . Sexual Activity: Not on file   Other Topics Concern  . Not on file   Social History Narrative   Married, no children      ROS:  General: Negative for anorexia, weight loss, fever, chills, fatigue, weakness. Eyes: Negative for vision changes.  ENT: Negative for hoarseness, difficulty swallowing , nasal congestion. CV: Negative for chest pain, angina, palpitations, dyspnea on exertion, peripheral edema.  Respiratory: Negative for dyspnea at rest, dyspnea on exertion, cough, sputum, wheezing.  GI: See history of present illness. GU:  Negative for dysuria, hematuria, urinary incontinence, urinary frequency, nocturnal urination.  MS: Negative for joint pain, low back pain.  Derm: Negative for rash or itching.  Neuro: Negative for weakness, abnormal sensation, seizure, frequent headaches,  memory loss, confusion.  Psych: Negative for anxiety, depression, suicidal ideation, hallucinations.  Endo: Negative for unusual weight change.  Heme: Negative for bruising or bleeding. Allergy: Negative for rash or hives.    Physical Examination:  BP 112/85 mmHg  Pulse 67  Temp(Src) 97.6 F (36.4 C) (Oral)  Ht 5' 4.5" (1.638 m)  Wt 163 lb (73.936 kg)  BMI 27.56 kg/m2   General: Well-nourished, well-developed in no acute distress.  Head: Normocephalic, atraumatic.   Eyes: Conjunctiva pink, no icterus. Mouth: Oropharyngeal mucosa moist and pink , no lesions erythema or exudate. Neck: Supple without thyromegaly, masses, or lymphadenopathy.  Lungs: Clear to auscultation bilaterally.  Heart: Regular rate and rhythm, no murmurs rubs or gallops.  Abdomen: Bowel sounds are normal, nontender, nondistended, no hepatosplenomegaly or masses, no abdominal bruits or    hernia , no rebound or guarding.   Rectal: deferred Extremities: No lower extremity edema. No clubbing or deformities.  Neuro: Alert and oriented x 4 , grossly normal neurologically.  Skin: Warm and dry, no rash or jaundice.   Psych: Alert and cooperative, normal mood and affect.

## 2015-08-28 NOTE — Interval H&P Note (Signed)
History and Physical Interval Note:  08/28/2015 1:00 PM  Debra Taylor  has presented today for surgery, with the diagnosis of ulcerative colitis  The various methods of treatment have been discussed with the patient and family. After consideration of risks, benefits and other options for treatment, the patient has consented to  Procedure(s) with comments: COLONOSCOPY (N/A) - 1215-moved to 1300 as a surgical intervention .  The patient's history has been reviewed, patient examined, no change in status, stable for surgery.  I have reviewed the patient's chart and labs.  Questions were answered to the patient's satisfaction.     Debra Taylor   No change. Surveillance colonoscopy per plan.  The risks, benefits, limitations, alternatives and imponderables have been reviewed with the patient. Questions have been answered. All parties are agreeable.

## 2015-09-02 ENCOUNTER — Encounter (HOSPITAL_COMMUNITY): Payer: Self-pay | Admitting: Internal Medicine

## 2015-09-03 NOTE — Progress Notes (Signed)
HFP was done at the hospital. See result note.

## 2015-09-04 ENCOUNTER — Telehealth: Payer: Self-pay

## 2015-09-04 ENCOUNTER — Encounter: Payer: Self-pay | Admitting: Internal Medicine

## 2015-09-04 ENCOUNTER — Other Ambulatory Visit: Payer: Self-pay | Admitting: Internal Medicine

## 2015-09-04 DIAGNOSIS — K5100 Ulcerative (chronic) pancolitis without complications: Secondary | ICD-10-CM

## 2015-09-04 NOTE — Telephone Encounter (Signed)
Letter mailed to the pt. 

## 2015-09-04 NOTE — Telephone Encounter (Signed)
Per RMR-   Send letter to patient.  Send copy of letter with path to referring provider and PCP. tcs 2 years; ov in 1 year

## 2015-09-04 NOTE — Telephone Encounter (Signed)
Reminders in epic °

## 2015-12-18 ENCOUNTER — Telehealth: Payer: Self-pay | Admitting: General Practice

## 2015-12-18 NOTE — Telephone Encounter (Signed)
The patient received a letter from [facility] stating they will not cover the entire amount of her Delzicol, however she will see how much it will cost after 01/21/16.   She stated she will call later if she decides she may need to change medication due to cost.

## 2016-07-13 ENCOUNTER — Encounter: Payer: Self-pay | Admitting: Internal Medicine

## 2016-07-26 ENCOUNTER — Other Ambulatory Visit: Payer: Self-pay

## 2016-07-26 DIAGNOSIS — K5100 Ulcerative (chronic) pancolitis without complications: Secondary | ICD-10-CM

## 2016-08-23 LAB — BASIC METABOLIC PANEL
BUN: 12 mg/dL (ref 7–25)
CALCIUM: 8.8 mg/dL (ref 8.6–10.4)
CO2: 28 mmol/L (ref 20–31)
CREATININE: 0.68 mg/dL (ref 0.50–1.05)
Chloride: 105 mmol/L (ref 98–110)
GLUCOSE: 92 mg/dL (ref 65–99)
Potassium: 4.4 mmol/L (ref 3.5–5.3)
SODIUM: 138 mmol/L (ref 135–146)

## 2016-08-23 LAB — HEPATIC FUNCTION PANEL
ALBUMIN: 3.4 g/dL — AB (ref 3.6–5.1)
ALK PHOS: 66 U/L (ref 33–130)
ALT: 7 U/L (ref 6–29)
AST: 14 U/L (ref 10–35)
BILIRUBIN TOTAL: 0.2 mg/dL (ref 0.2–1.2)
Bilirubin, Direct: 0 mg/dL (ref <=0.2)
Indirect Bilirubin: 0.2 mg/dL (ref 0.2–1.2)
Total Protein: 6.7 g/dL (ref 6.1–8.1)

## 2016-08-24 ENCOUNTER — Ambulatory Visit (INDEPENDENT_AMBULATORY_CARE_PROVIDER_SITE_OTHER): Payer: BLUE CROSS/BLUE SHIELD | Admitting: Gastroenterology

## 2016-08-24 ENCOUNTER — Encounter: Payer: Self-pay | Admitting: Gastroenterology

## 2016-08-24 VITALS — BP 129/69 | HR 48 | Temp 97.6°F | Ht 64.0 in | Wt 170.2 lb

## 2016-08-24 DIAGNOSIS — K5100 Ulcerative (chronic) pancolitis without complications: Secondary | ICD-10-CM

## 2016-08-24 NOTE — Patient Instructions (Signed)
1. Continue Asacol HD, try taking at least twice daily as discussed. 2. Return to the office in one year and we will get you scheduled for next colonoscopy at that time.

## 2016-08-24 NOTE — Assessment & Plan Note (Signed)
Clinically doing very well. Encouraged her to take Asacol HD twice daily. Glad she was able to get off of NSAIDs. Recent LFTs, met 7 unremarkable. She'll be due for follow-up in one year and at that time we will schedule her next surveillance colonoscopy. She will call in the interim with any problems. 

## 2016-08-24 NOTE — Progress Notes (Signed)
cc'ed to pcp °

## 2016-08-24 NOTE — Progress Notes (Signed)
Primary Care Physician: Caryl Bis, MD  Primary Gastroenterologist:  Garfield Cornea, MD   Chief Complaint  Patient presents with  . Ulcerative Colitis    f/u, doing ok    HPI: Debra Taylor is a 51 y.o. female here for one-year follow-up of ulcerative colitis diagnosed 20+ years ago. She had surveillance colonoscopy June 2017. Endoscopically she had some inflammation in the descending colon to the splenic flexure mild in severity and compared previous exams very similar. Segmental biopsies were taken of look for dysplasia. Biopsy showed benign colonic mucosa with no active colitis except for mild active chronic colitis in the rectum. Next surveillance exam in June 2019.  Clinically she's been doing very well. Currently she's taking Asacol HD 82m once daily for the most part. If she has any loosening of her stools she will take 2-3 times daily. She reports only one flare in the last year and that was in the setting of antibiotics. She denies blood in the stool. She has a bowel movement every day to every other day. Normal consistency. Denies abdominal pain. Appetite is been good. In fact she reports a 10-15 pound weight gain since she stopped Topamax. She no longer takes Aleve. She's now on propranolol for migraines doing very well. Used to have 3 migraines per week and now has only had 3 in the last 6 months. No upper GI symptoms.      Current Outpatient Prescriptions  Medication Sig Dispense Refill  . ASACOL HD 800 MG TBEC Take 800 mg by mouth 3 (three) times daily.    .Marland Kitchendoxycycline (VIBRAMYCIN) 50 MG capsule Take 50 mg by mouth daily.     . propranolol (INDERAL) 40 MG tablet Take 40 mg by mouth 2 (two) times daily. For migraines    . rizatriptan (MAXALT-MLT) 10 MG disintegrating tablet Take 10 mg by mouth as needed for migraine. May repeat in 2 hours if needed    . TRI-SPRINTEC 0.18/0.215/0.25 MG-35 MCG tablet Take 1 tablet by mouth daily.     No current  facility-administered medications for this visit.     Allergies as of 08/24/2016  . (No Known Allergies)    ROS:  General: Negative for anorexia, weight loss, fever, chills, fatigue, weakness. ENT: Negative for hoarseness, difficulty swallowing , nasal congestion. CV: Negative for chest pain, angina, palpitations, dyspnea on exertion, peripheral edema.  Respiratory: Negative for dyspnea at rest, dyspnea on exertion, cough, sputum, wheezing.  GI: See history of present illness. GU:  Negative for dysuria, hematuria, urinary incontinence, urinary frequency, nocturnal urination.  Endo: Negative for unusual weight change.    Physical Examination:   BP 129/69   Pulse (!) 48   Temp 97.6 F (36.4 C) (Oral)   Ht 5' 4"  (1.626 m)   Wt 170 lb 3.2 oz (77.2 kg)   LMP 06/25/2016 Comment: birth control for migraines  BMI 29.21 kg/m   General: Well-nourished, well-developed in no acute distress.  Eyes: No icterus. Mouth: Oropharyngeal mucosa moist and pink , no lesions erythema or exudate. Lungs: Clear to auscultation bilaterally.  Heart: Regular rate and rhythm, no murmurs rubs or gallops.  Abdomen: Bowel sounds are normal, nontender, nondistended, no hepatosplenomegaly or masses, no abdominal bruits or hernia , no rebound or guarding.   Extremities: No lower extremity edema. No clubbing or deformities. Neuro: Alert and oriented x 4   Skin: Warm and dry, no jaundice.   Psych: Alert and cooperative, normal mood and affect.  Labs:  Lab Results  Component Value Date   CREATININE 0.68 08/23/2016   BUN 12 08/23/2016   NA 138 08/23/2016   K 4.4 08/23/2016   CL 105 08/23/2016   CO2 28 08/23/2016   Lab Results  Component Value Date   ALT 7 08/23/2016   AST 14 08/23/2016   ALKPHOS 66 08/23/2016   BILITOT 0.2 08/23/2016    Imaging Studies: No results found.

## 2017-06-30 ENCOUNTER — Encounter: Payer: Self-pay | Admitting: Internal Medicine

## 2017-08-25 ENCOUNTER — Ambulatory Visit (INDEPENDENT_AMBULATORY_CARE_PROVIDER_SITE_OTHER): Payer: Self-pay

## 2017-08-25 DIAGNOSIS — Z86010 Personal history of colonic polyps: Secondary | ICD-10-CM

## 2017-08-25 DIAGNOSIS — K5100 Ulcerative (chronic) pancolitis without complications: Secondary | ICD-10-CM

## 2017-08-25 MED ORDER — NA SULFATE-K SULFATE-MG SULF 17.5-3.13-1.6 GM/177ML PO SOLN: 1.0000 | ORAL | 0 refills | Status: DC

## 2017-08-25 NOTE — Patient Instructions (Addendum)
Debra Taylor  February 23, 1966 MRN: [id number]     Procedure Date: 09/02/17 Time to register: 6:30am Place to register: [facility] Procedure Time: 7:30am Scheduled provider: R. Garfield Cornea, MD    PREPARATION FOR COLONOSCOPY WITH SUPREP BOWEL PREP KIT  Note: Suprep Bowel Prep Kit is a split-dose (2day) regimen. Consumption of BOTH 6-ounce bottles is required for a complete prep.  Please notify us immediately if you are diabetic, take iron supplements, or if you are on Coumadin or any other blood thinners.                                                                                                                                                    2 DAYS BEFORE PROCEDURE:  DATE: 08/31/17   DAY: Wednesday Begin clear liquid diet AFTER your lunch meal. NO SOLID FOODS after this point.  1 DAY BEFORE PROCEDURE:  DATE: 09/01/17   DAY: Thursday Continue clear liquids the entire day - NO SOLID FOOD.     At 6:00pm: Complete steps 1 through 4 below, using ONE (1) 6-ounce bottle, before going to bed. Step 1:  Pour ONE (1) 6-ounce bottle of SUPREP liquid into the mixing container.  Step 2:  Add cool drinking water to the 16 ounce line on the container and mix.  Note: Dilute the solution concentrate as directed prior to use. Step 3:  DRINK ALL the liquid in the container. Step 4:  You MUST drink an additional two (2) or more 16 ounce containers of water over the next one (1) hour.   Continue clear liquids only EXCEPTION: If you take medications for your heart, blood pressure, or breathing, you may take these medications with a small amount of clear liquid.   DAY OF PROCEDURE:   DATE: 09/02/17   DAY: Friday    5 hours before your procedure at 2:30am: Step 1:  Pour ONE (1) 6-ounce bottle of SUPREP liquid into the mixing container.  Step 2:  Add cool drinking water to the 16 ounce line on the container and mix.  Note: Dilute the solution concentrate as directed prior to  use. Step 3:  DRINK ALL the liquid in the container. Step 4:  You MUST drink an additional two (2) or more 16 ounce containers of water over the next one (1) hour. You MUST complete the final glass of water at least 3 hours before your colonoscopy.   Nothing by mouth past 4:30am  You may take your morning medications with sip of water unless we have instructed otherwise.    Please see below for Dietary Information.  CLEAR LIQUIDS INCLUDE:  Water Jello (NOT red in color)   Ice Popsicles (NOT red in color)   Tea (sugar ok, no milk/cream) Powdered fruit flavored drinks  Coffee (sugar ok, no milk/cream) Gatorade/ Lemonade/ Kool-Aid  (  NOT red in color)   Juice: apple, white grape, white cranberry Soft drinks  Clear bullion, consomme, broth (fat free beef/chicken/vegetable)  Carbonated beverages (any kind)  Strained chicken noodle soup Hard Candy   Remember: Clear liquids are liquids that will allow you to see your fingers on the other side of a clear glass. Be sure liquids are NOT red in color, and not cloudy, but CLEAR.  DO NOT EAT OR DRINK ANY OF THE FOLLOWING:  Dairy products of any kind   Cranberry juice Tomato juice / V8 juice   Grapefruit juice Orange juice     Red grape juice  Do not eat any solid foods, including such foods as: cereal, oatmeal, yogurt, fruits, vegetables, creamed soups, eggs, bread, crackers, pureed foods in a blender, etc.   HELPFUL HINTS FOR DRINKING PREP SOLUTION:   Make sure prep is extremely cold. Mix and refrigerate the the morning of the prep. You may also put in the freezer.   You may try mixing some Crystal Light or Country Time Lemonade if you prefer. Mix in small amounts; add more if necessary.  Try drinking through a straw  Rinse mouth with water or a mouthwash between glasses, to remove after-taste.  Try sipping on a cold beverage /ice/ popsicles between glasses of prep.  Place a piece of sugar-free hard candy in mouth between glasses.  If  you become nauseated, try consuming smaller amounts, or stretch out the time between glasses. Stop for 30-60 minutes, then slowly start back drinking.     OTHER INSTRUCTIONS  You will need a responsible adult at least 52 years of age to accompany you and drive you home. This person must remain in the waiting room during your procedure. The hospital will cancel your procedure if you do not have a responsible adult with you.   1. Wear loose fitting clothing that is easily removed. 2. Leave jewelry and other valuables at home.  3. Remove all body piercing jewelry and leave at home. 4. Total time from sign-in until discharge is approximately 2-3 hours. 5. You should go home directly after your procedure and rest. You can resume normal activities the day after your procedure. 6. The day of your procedure you should not:  Drive  Make legal decisions  Operate machinery  Drink alcohol  Return to work   You may call the office (Dept: [phone]) before 5:00pm, or page the doctor on call [phone]) after 5:00pm, for further instructions, if necessary.   Insurance Information YOU WILL NEED TO CHECK WITH YOUR INSURANCE COMPANY FOR THE BENEFITS OF COVERAGE YOU HAVE FOR THIS PROCEDURE.  UNFORTUNATELY, NOT ALL INSURANCE COMPANIES HAVE BENEFITS TO COVER ALL OR PART OF THESE TYPES OF PROCEDURES.  IT IS YOUR RESPONSIBILITY TO CHECK YOUR BENEFITS, HOWEVER, WE WILL BE GLAD TO ASSIST YOU WITH ANY CODES YOUR INSURANCE COMPANY MAY NEED.    PLEASE NOTE THAT MOST INSURANCE COMPANIES WILL NOT COVER A SCREENING COLONOSCOPY FOR PEOPLE UNDER THE AGE OF 50  IF YOU HAVE BCBS INSURANCE, YOU MAY HAVE BENEFITS FOR A SCREENING COLONOSCOPY BUT IF POLYPS ARE FOUND THE DIAGNOSIS WILL CHANGE AND THEN YOU MAY HAVE A DEDUCTIBLE THAT WILL NEED TO BE MET. SO PLEASE MAKE SURE YOU CHECK YOUR BENEFITS FOR A SCREENING COLONOSCOPY AS WELL AS A DIAGNOSTIC COLONOSCOPY.

## 2017-08-25 NOTE — Progress Notes (Addendum)
Gastroenterology Pre-Procedure Review  Request Date:08/25/17 Requesting Physician: 2 year recall- last tcs- RMR-08/24/16, no polyps +inflammation.   PATIENT REVIEW QUESTIONS: The patient responded to the following health history questions as indicated:    1. Diabetes Melitis: no 2. Joint replacements in the past 12 months: no 3. Major health problems in the past 3 months: no 4. Has an artificial valve or MVP: no 5. Has a defibrillator: no 6. Has been advised in past to take antibiotics in advance of a procedure like teeth cleaning: no 7. Family history of colon cancer: yes (cousin)  64. Alcohol Use: no 9. History of sleep apnea: no  10. History of coronary artery or other vascular stents placed within the last 12 months: no 11. History of any prior anesthesia complications: no    MEDICATIONS & ALLERGIES:    Patient reports the following regarding taking any blood thinners:   Plavix? no Aspirin? no Coumadin? no Brilinta? no Xarelto? no Eliquis? no Pradaxa? no Savaysa? no Effient? no  Patient confirms/reports the following medications:  Current Outpatient Medications  Medication Sig Dispense Refill  . acetaminophen (TYLENOL) 500 MG tablet Take 500 mg by mouth every 6 (six) hours as needed.    . ALPRAZolam (XANAX) 0.25 MG tablet Take 0.25 mg by mouth as needed for anxiety (takes twice a year).    . cetirizine (ZYRTEC) 10 MG tablet Take 10 mg by mouth as needed for allergies.    Marland Kitchen doxycycline (VIBRAMYCIN) 50 MG capsule Take 50 mg by mouth daily.     . fexofenadine-pseudoephedrine (ALLEGRA-D 24) 180-240 MG 24 hr tablet Take 1 tablet by mouth as needed.    . mesalamine (APRISO) 0.375 g 24 hr capsule Take 375 mg by mouth daily. Takes 2 qam    . propranolol (INDERAL) 40 MG tablet Take 40 mg by mouth 2 (two) times daily. For migraines    . TRI-SPRINTEC 0.18/0.215/0.25 MG-35 MCG tablet Take 1 tablet by mouth daily.     No current facility-administered medications for this visit.      Patient confirms/reports the following allergies:  No Known Allergies  No orders of the defined types were placed in this encounter.   AUTHORIZATION INFORMATION Primary Insurance: [place],  [place] #:[id number] Pre-Cert / Josem Kaufmann required: yes Pre-Cert / Auth #: [id number]   SCHEDULE INFORMATION: Procedure has been scheduled as follows:  Date: 10/12/17, Time: 12:00 Location: [facility] RMR  This Gastroenterology Pre-Precedure Review Form is being routed to the following provider(s): AB

## 2017-08-26 NOTE — Progress Notes (Signed)
Last colonoscopy actually 2017. Appropriate.

## 2017-08-29 NOTE — Progress Notes (Addendum)
Pt stated during nurse visit that she would be willing to move up her appt to get in sooner, even if it was last minute. We had 2 cancellations this week and she has moved up to 09/02/17. I called Kim and moved her appt in endo and changed it on our schedule. The pt and I went over her instructions and the new dates and times and she understands when she needs to be on clear liquids and what times she needs to do her prep. I have changed the dates and times on her instructions.   Correction: date of last tcs was 08/28/15

## 2017-09-02 ENCOUNTER — Encounter (HOSPITAL_COMMUNITY): Payer: Self-pay | Admitting: *Deleted

## 2017-09-02 ENCOUNTER — Ambulatory Visit (HOSPITAL_COMMUNITY)
Admission: RE | Admit: 2017-09-02 | Discharge: 2017-09-02 | Disposition: A | Discharge status: Home / Self Care | Payer: 59 | Source: Ambulatory Visit | Attending: Internal Medicine | Admitting: Internal Medicine

## 2017-09-02 ENCOUNTER — Encounter (HOSPITAL_COMMUNITY)
Admission: RE | Disposition: A | Discharge status: Home / Self Care | Payer: Self-pay | Source: Ambulatory Visit | Attending: Internal Medicine

## 2017-09-02 ENCOUNTER — Other Ambulatory Visit: Payer: Self-pay

## 2017-09-02 DIAGNOSIS — Z793 Long term (current) use of hormonal contraceptives: Secondary | ICD-10-CM | POA: Insufficient documentation

## 2017-09-02 DIAGNOSIS — G43909 Migraine, unspecified, not intractable, without status migrainosus: Secondary | ICD-10-CM | POA: Insufficient documentation

## 2017-09-02 DIAGNOSIS — Z79899 Other long term (current) drug therapy: Secondary | ICD-10-CM | POA: Insufficient documentation

## 2017-09-02 DIAGNOSIS — K5190 Ulcerative colitis, unspecified, without complications: Secondary | ICD-10-CM | POA: Insufficient documentation

## 2017-09-02 DIAGNOSIS — Z86010 Personal history of colonic polyps: Secondary | ICD-10-CM

## 2017-09-02 DIAGNOSIS — Z1211 Encounter for screening for malignant neoplasm of colon: Secondary | ICD-10-CM

## 2017-09-02 DIAGNOSIS — K5150 Left sided colitis without complications: Secondary | ICD-10-CM

## 2017-09-02 HISTORY — PX: COLONOSCOPY: SHX5424

## 2017-09-02 HISTORY — PX: BIOPSY: SHX5522

## 2017-09-02 SURGERY — COLONOSCOPY
Anesthesia: Moderate Sedation

## 2017-09-02 MED ORDER — MEPERIDINE HCL 100 MG/ML IJ SOLN
INTRAMUSCULAR | Status: AC
  Filled 2017-09-02: qty 2

## 2017-09-02 MED ORDER — MIDAZOLAM HCL 5 MG/5ML IJ SOLN
INTRAMUSCULAR | Status: AC
  Filled 2017-09-02: qty 10

## 2017-09-02 MED ORDER — ONDANSETRON HCL 4 MG/2ML IJ SOLN
INTRAMUSCULAR | Status: AC
  Filled 2017-09-02: qty 2

## 2017-09-02 MED ORDER — ONDANSETRON HCL 4 MG/2ML IJ SOLN
INTRAMUSCULAR | Status: DC | PRN
  Administered 2017-09-02: 4 mg via INTRAVENOUS

## 2017-09-02 MED ORDER — MEPERIDINE HCL 100 MG/ML IJ SOLN
INTRAMUSCULAR | Status: DC | PRN
  Administered 2017-09-02: 50 mg via INTRAVENOUS
  Administered 2017-09-02: 25 mg via INTRAVENOUS
  Administered 2017-09-02: 50 mg via INTRAVENOUS

## 2017-09-02 MED ORDER — MIDAZOLAM HCL 5 MG/5ML IJ SOLN
INTRAMUSCULAR | Status: DC | PRN
  Administered 2017-09-02 (×3): 2 mg via INTRAVENOUS

## 2017-09-02 MED ORDER — SODIUM CHLORIDE 0.9 % IV SOLN
INTRAVENOUS | Status: DC
  Administered 2017-09-02: 07:00:00 via INTRAVENOUS

## 2017-09-02 NOTE — H&P (Signed)
_0 @   Primary Care Physician:  Caryl Bis, MD Primary Gastroenterologist:  Dr.   Pre-Procedure History & Physical: HPI:  Debra Taylor is a 52 y.o. female here for surveilance colonoscopy. 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 Doing well now, present. Hass completely eliminate NSAIDs which has helped.  Past Medical History:  Diagnosis Date  . Chest pain   . Depression   . Hyperlipidemia   . Migraine headache   . Premenopausal patient    Conctraception  . Ulcerative colitis    Onset at age 18    Past Surgical History:  Procedure Laterality Date  . COLONOSCOPY  08/2009   normal terminal ileum/inflammatory changes of the rectum and colon with panproctocolitis. No dysplasia on bx.   . COLONOSCOPY  09/16/2011   RMR: Findings/Biopsies consistent with chronically mild active pan ulcerative colitis-endoscopically mild appearing. Status post segmental biopsy  . COLONOSCOPY N/A 08/30/2013   RMR: Endoscopically active distal proctocolitis, comparable biopsies. No dysplasia.  . COLONOSCOPY N/A 08/28/2015   Procedure: COLONOSCOPY;  Surgeon: Daneil Dolin, MD;  Location: [facility]ITE;  Service: Endoscopy;  Laterality: N/A;  1215-moved to 1300  . DENTAL SURGERY      Prior to Admission medications   Medication Sig Start Date End Date Taking? Authorizing Provider  acetaminophen (TYLENOL) 500 MG tablet Take 1,000 mg by mouth every 6 (six) hours as needed for moderate pain.    Yes [provider]  Carboxymethylcellul-Glycerin (REFRESH OPTIVE) 1-0.9 % GEL Place 1 drop into both eyes daily as needed (dry eyes).   Yes [provider]  cetirizine (ZYRTEC) 10 MG tablet Take 10 mg by mouth daily as needed for allergies.   Yes [provider]  cetirizine-pseudoephedrine (ZYRTEC-D) 5-120 MG tablet Take 1 tablet by mouth daily as needed for allergies.   Yes [provider]  doxycycline (VIBRAMYCIN) 50 MG capsule Take 50 mg by mouth daily.  07/24/15  Yes [provider]  mesalamine (APRISO) 0.375 g 24 hr capsule Take 750 mg by mouth daily.    Yes [provider]  Na Sulfate-K Sulfate-Mg Sulf (SUPREP BOWEL PREP KIT) 17.5-3.13-1.6 GM/177ML SOLN Take 1 kit by mouth as directed. 08/25/17  Yes Annitta Needs, NP  propranolol (INDERAL) 40 MG tablet Take 40 mg by mouth 2 (two) times daily. For migraines 06/02/16  Yes [provider]  TRI-SPRINTEC 0.18/0.215/0.25 MG-35 MCG tablet Take 1 tablet by mouth daily. 08/11/16  Yes [provider]    Allergies as of 08/25/2017  . (No Known Allergies)    Family History  Problem Relation Age of Onset  . Heart attack Father   . Lung disease Father   . Uterine cancer Mother   . Ovarian cancer Mother   . Heart attack Brother   . Colon cancer Neg Hx   . Breast cancer Neg Hx   . Lung cancer Neg Hx     Social History   Socioeconomic History  . Marital status: Married    Spouse name: Not on file  . Number of children: 0  . Years of education: Not on file  . Highest education level: Not on file  Occupational History  . Occupation: [employer]  Social Needs  . Financial resource strain: Not on file  . Food insecurity:    Worry: Not on file    Inability: Not on file  . Transportation needs:    Medical: Not on  file    Non-medical: Not on file  Tobacco Use  . Smoking status: Never Smoker  . Smokeless tobacco: Never Used  Substance and Sexual Activity  . Alcohol use: Yes    Comment: Occasional  . Drug use: No  . Sexual activity: Not on file  Lifestyle  . Physical activity:    Days per week: Not on file    Minutes per session: Not on file  . Stress: Not on file  Relationships  . Social connections:    Talks on phone: Not on file    Gets together: Not on file    Attends religious service: Not on file    Active member of club or organization: Not on file    Attends meetings of clubs or organizations: Not  on file    Relationship status: Not on file  . Intimate partner violence:    Fear of current or ex partner: Not on file    Emotionally abused: Not on file    Physically abused: Not on file    Forced sexual activity: Not on file  Other Topics Concern  . Not on file  Social History Narrative   Married, no children    Review of Systems: See HPI, otherwise negative ROS  Physical Exam: BP (!) 151/81   Pulse 67   Temp 98.3 F (36.8 C) (Oral)   Resp 17   Ht _0  (1.626 m)   Wt 170 lb (77.1 kg)   SpO2 100%   BMI 29.18 kg/m  General:   Alert,  Well-developed, well-nourished, pleasant and cooperative in NAD Neck:  Supple; no masses or thyromegaly. No significant cervical adenopathy. Lungs:  Clear throughout to auscultation.   No wheezes, crackles, or rhonchi. No acute distress. Heart:  Regular rate and rhythm; no murmurs, clicks, rubs,  or gallops. Abdomen: Non-distended, normal bowel sounds.  Soft and nontender without appreciable mass or hepatosplenomegaly.  Pulses:  Normal pulses noted. Extremities:  Without clubbing or edema.  Impression/Plan:   Long-standing left-sided UC. Here for surveillance colonoscopy.  The risks, benefits, limitations, alternatives and imponderables have been reviewed with the patient. Questions have been answered. All parties are agreeable.      Notice: This dictation was prepared with Dragon dictation along with smaller phrase technology. Any transcriptional errors that result from this process are unintentional and may not be corrected upon review.

## 2017-09-02 NOTE — Discharge Instructions (Signed)
°  Colonoscopy Discharge Instructions  Read the instructions outlined below and refer to this sheet in the next few weeks. These discharge instructions provide you with general information on caring for yourself after you leave the hospital. Your doctor may also give you specific instructions. While your treatment has been planned according to the most current medical practices available, unavoidable complications occasionally occur. If you have any problems or questions after discharge, call Dr. Gala Romney at [phone].  After hours and weekends call the hospital and have the GI doctor on call paged.  They will call you back. ACTIVITY  You may resume your regular activity, but move at a slower pace for the next 24 hours.   Take frequent rest periods for the next 24 hours.   Walking will help get rid of the air and reduce the bloated feeling in your belly (abdomen).   No driving for 24 hours (because of the medicine (anesthesia) used during the test).    Do not sign any important legal documents or operate any machinery for 24 hours (because of the anesthesia used during the test).  NUTRITION  Drink plenty of fluids.   You may resume your normal diet as instructed by your doctor.   Begin with a light meal and progress to your normal diet. Heavy or fried foods are harder to digest and may make you feel sick to your stomach (nauseated).   Avoid alcoholic beverages for 24 hours or as instructed.  MEDICATIONS  You may resume your normal medications unless your doctor tells you otherwise.  WHAT YOU CAN EXPECT TODAY  Some feelings of bloating in the abdomen.   Passage of more gas than usual.   Spotting of blood in your stool or on the toilet paper.  IF YOU HAD POLYPS REMOVED DURING THE COLONOSCOPY:  No aspirin products for 7 days or as instructed.   No alcohol for 7 days or as instructed.   Eat a soft diet for the next 24 hours.  FINDING OUT THE RESULTS OF YOUR TEST Not all test results  are available during your visit. If your test results are not back during the visit, make an appointment with your caregiver to find out the results. Do not assume everything is normal if you have not heard from your caregiver or the medical facility. It is important for you to follow up on all of your test results.  SEEK IMMEDIATE MEDICAL ATTENTION IF:  You have more than a spotting of blood in your stool.   Your belly is swollen (abdominal distention).   You are nauseated or vomiting.   You have a temperature over 101.   You have abdominal pain or discomfort that is severe or gets worse throughout the day.   Colon looked good today  Further recommendations to follow pending review of pathology report  Office visit with [place] in one year.  You are on the "recall" list".  Dr Roseanne Kaufman office should call you with an appointment.

## 2017-09-02 NOTE — Op Note (Signed)
[facility] Patient Name: Debra Taylor Procedure Date: 09/02/2017 7:30 AM MRN: [id number] Date of Birth: 1966-02-17 Attending MD: Norvel Richards , MD CSN: [id number] Age: 52 Admit Type: Outpatient Procedure:                Colonoscopy Indications:              High risk colon cancer surveillance: Ulcerative                            left sided colitis, High risk colon cancer                            surveillance: Ulcerative left sided colitis of 15                            (or more) years duration Providers:                Norvel Richards, MD, Lurline Del, RN, Randa Spike, Technician Referring MD:              Medicines:                Midazolam 6 mg IV, Meperidine 967 mg IV Complications:            No immediate complications. Estimated Blood Loss:     Estimated blood loss was minimal. Procedure:                Pre-Anesthesia Assessment:                           - Prior to the procedure, a History and Physical                            was performed, and patient medications and                            allergies were reviewed. The patient's tolerance of                            previous anesthesia was also reviewed. The risks                            and benefits of the procedure and the sedation                            options and risks were discussed with the patient.                            All questions were answered, and informed consent                            was obtained. Prior Anticoagulants: The patient has  taken no previous anticoagulant or antiplatelet                            agents. ASA Grade Assessment: II - A patient with                            mild systemic disease. After reviewing the risks                            and benefits, the patient was deemed in                            satisfactory condition to undergo the procedure.                           After  obtaining informed consent, the colonoscope                            was passed under direct vision. Throughout the                            procedure, the patient's blood pressure, pulse, and                            oxygen saturations were monitored continuously. The                            EC-[id number] ([id number]) scope was introduced through                            the anus and advanced to the 5 cm into the ileum.                            The colonoscopy was performed without difficulty.                            The patient tolerated the procedure well. The                            quality of the bowel preparation was adequate. The                            entire colon was well visualized. Scope In: 7:53:21 AM Scope Out: 8:10:27 AM Scope Withdrawal Time: 0 hours 11 minutes 50 seconds  Total Procedure Duration: 0 hours 17 minutes 6 seconds  Findings:      The perianal and digital rectal examinations were normal. minimally       pale/granular rectal and sigmoid mucosa. No erosions or ulcerations.       Preservation of the normal vascular pattern seen. Proximal to these       areas, all the way to the cecum, the mucosa appeared normal. Distal 5 cm       of terminal ileum mucosa also appeared normal.      Frequent biopsies of the ascending transverse,  descending sigmoid and       rectal mucosa taken for histologic study. Impression:               - Minimal mucosal changes of the rectum and sigmoid                            consistent with history of left-sided                            proctocolitis. Disease appears quiscent                            endoscopically; this correlates with how the                            patient is doing clinically. Serum creatinine                            normal 1 year ago. No more NSAIDs. Taking Apriso -                            tolerating well. Moderate Sedation:      Moderate (conscious) sedation was administered by the  endoscopy nurse       and supervised by the endoscopist. The following parameters were       monitored: oxygen saturation, heart rate, blood pressure, respiratory       rate, EKG, adequacy of pulmonary ventilation, and response to care.       Total physician intraservice time was 28 minutes. Recommendation:           - Patient has a contact number available for                            emergencies. The signs and symptoms of potential                            delayed complications were discussed with the                            patient. Return to normal activities tomorrow.                            Written discharge instructions were provided to the                            patient. Procedure Code(s):        --- Professional ---                           [phone], Colonoscopy, flexible; diagnostic, including                            collection of specimen(s) by brushing or washing,                            when performed (separate procedure)  G0500, Moderate sedation services provided by the                            same physician or other qualified health care                            professional performing a gastrointestinal                            endoscopic service that sedation supports,                            requiring the presence of an independent trained                            observer to assist in the monitoring of the                            patient's level of consciousness and physiological                            status; initial 15 minutes of intra-service time;                            patient age 73 years or older (additional time may                            be reported with [phone], as appropriate)                           [phone], Moderate sedation services provided by the                            same physician or other qualified health care                            professional performing the diagnostic or                             therapeutic service that the sedation supports,                            requiring the presence of an independent trained                            observer to assist in the monitoring of the                            patient's level of consciousness and physiological                            status; each additional 15 minutes intraservice  time (List separately in addition to code for                            primary service) Diagnosis Code(s):        --- Professional ---                           K51.50, Left sided colitis without complications CPT copyright 2017 American Medical Association. All rights reserved. The codes documented in this report are preliminary and upon coder review may  be revised to meet current compliance requirements. Cristopher Estimable. Deandre Brannan, MD Norvel Richards, MD 09/02/2017 8:25:26 AM This report has been signed electronically. Number of Addenda: 0

## 2017-09-06 ENCOUNTER — Encounter (HOSPITAL_COMMUNITY): Payer: Self-pay | Admitting: Internal Medicine

## 2017-09-09 ENCOUNTER — Encounter: Payer: Self-pay | Admitting: Internal Medicine

## 2017-11-23 ENCOUNTER — Emergency Department (HOSPITAL_COMMUNITY)
Admission: EM | Admit: 2017-11-23 | Discharge: 2017-11-23 | Disposition: A | Discharge status: Home / Self Care | Payer: 59 | Attending: Emergency Medicine | Admitting: Emergency Medicine

## 2017-11-23 ENCOUNTER — Emergency Department (HOSPITAL_COMMUNITY): Payer: 59

## 2017-11-23 ENCOUNTER — Encounter (HOSPITAL_COMMUNITY): Payer: Self-pay | Admitting: Emergency Medicine

## 2017-11-23 ENCOUNTER — Other Ambulatory Visit: Payer: Self-pay

## 2017-11-23 DIAGNOSIS — Y92410 Unspecified street and highway as the place of occurrence of the external cause: Secondary | ICD-10-CM | POA: Insufficient documentation

## 2017-11-23 DIAGNOSIS — S161XXA Strain of muscle, fascia and tendon at neck level, initial encounter: Secondary | ICD-10-CM | POA: Diagnosis not present

## 2017-11-23 DIAGNOSIS — Y9389 Activity, other specified: Secondary | ICD-10-CM | POA: Insufficient documentation

## 2017-11-23 DIAGNOSIS — M62830 Muscle spasm of back: Secondary | ICD-10-CM | POA: Insufficient documentation

## 2017-11-23 DIAGNOSIS — Z79899 Other long term (current) drug therapy: Secondary | ICD-10-CM | POA: Diagnosis not present

## 2017-11-23 DIAGNOSIS — S199XXA Unspecified injury of neck, initial encounter: Secondary | ICD-10-CM | POA: Diagnosis present

## 2017-11-23 DIAGNOSIS — Y999 Unspecified external cause status: Secondary | ICD-10-CM | POA: Insufficient documentation

## 2017-11-23 MED ORDER — ACETAMINOPHEN 500 MG PO TABS
1000.0000 mg | ORAL_TABLET | Freq: Once | ORAL | Status: AC
  Administered 2017-11-23: 1000 mg via ORAL
  Filled 2017-11-23: qty 2

## 2017-11-23 MED ORDER — METHOCARBAMOL 500 MG PO TABS: 500.0000 mg | ORAL_TABLET | Freq: Three times a day (TID) | ORAL | 0 refills | Status: DC

## 2017-11-23 MED ORDER — IBUPROFEN 800 MG PO TABS
800.0000 mg | ORAL_TABLET | Freq: Once | ORAL | Status: DC
  Filled 2017-11-23: qty 1
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
## 2017-11-23 NOTE — ED Notes (Signed)
PA at bedside.
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
## 2018-08-15 ENCOUNTER — Encounter: Payer: Self-pay | Admitting: Internal Medicine

## 2018-10-17 ENCOUNTER — Ambulatory Visit (INDEPENDENT_AMBULATORY_CARE_PROVIDER_SITE_OTHER): Payer: BC Managed Care – PPO | Admitting: Gastroenterology

## 2018-10-17 ENCOUNTER — Encounter: Payer: Self-pay | Admitting: Gastroenterology

## 2018-10-17 ENCOUNTER — Other Ambulatory Visit: Payer: Self-pay

## 2018-10-17 VITALS — BP 105/65 | HR 60 | Temp 97.1°F | Ht 64.0 in | Wt 182.6 lb

## 2018-10-17 DIAGNOSIS — K5190 Ulcerative colitis, unspecified, without complications: Secondary | ICD-10-CM

## 2018-10-17 NOTE — Patient Instructions (Signed)
1. Maintenance dose of Apriso is 1.5 g daily.  You can take all 4 caps at the same time in the morning. 2. Return to the office in May 2021, we will schedule your colonoscopy at that time.

## 2018-10-17 NOTE — Progress Notes (Signed)
      Primary Care Physician: Caryl Bis, MD  Primary Gastroenterologist:  Garfield Cornea, MD   Chief Complaint  Patient presents with  . ulcerative pancolitis    doing ok    HPI: Debra Taylor is a 53 y.o. female here for follow-up of ulcerative colitis, diagnosed over 20 years ago.  Last Colonoscopy in June 2019 showed mild colitis in the rectum and left side of the colon.  Next colonoscopy planned for June 2021.  UC flare once in the past year.  She increased her Apriso from 2 to 4 capsules during this time.  Currently doing well. BM daily. No abdominal. Little heartburn once per month. No dysphagia.  Weight is up 12 pounds in the past year.  Recently started walking regimen.  Recently had yearly labs with Dr. Quillian Quince, we have requested records to follow-up on kidney/liver function.  Current Outpatient Medications  Medication Sig Dispense Refill  . acetaminophen (TYLENOL) 500 MG tablet Take 1,000 mg by mouth every 6 (six) hours as needed for moderate pain.     . Carboxymethylcellul-Glycerin (REFRESH OPTIVE) 1-0.9 % GEL Place 1 drop into both eyes daily as needed (dry eyes).    . cetirizine (ZYRTEC) 10 MG tablet Take 10 mg by mouth daily as needed for allergies.    . cetirizine-pseudoephedrine (ZYRTEC-D) 5-120 MG tablet Take 1 tablet by mouth daily as needed for allergies.    Marland Kitchen doxycycline (VIBRAMYCIN) 50 MG capsule Take 50 mg by mouth daily.     Marland Kitchen MAXALT-MLT 10 MG disintegrating tablet Take 1 tablet by mouth daily.    . mesalamine (APRISO) 0.375 g 24 hr capsule Take 750 mg by mouth daily.     . propranolol (INDERAL) 40 MG tablet Take 40 mg by mouth 2 (two) times daily. For migraines    . TRI-SPRINTEC 0.18/0.215/0.25 MG-35 MCG tablet Take 1 tablet by mouth daily.     No current facility-administered medications for this visit.     Allergies as of 10/17/2018  . (No Known Allergies)    ROS:  General: Negative for anorexia, weight loss, fever, chills, fatigue, weakness.  ENT: Negative for hoarseness, difficulty swallowing , nasal congestion. CV: Negative for chest pain, angina, palpitations, dyspnea on exertion, peripheral edema.  Respiratory: Negative for dyspnea at rest, dyspnea on exertion, cough, sputum, wheezing.  GI: See history of present illness. GU:  Negative for dysuria, hematuria, urinary incontinence, urinary frequency, nocturnal urination.  Endo: Negative for unusual weight change.    Physical Examination:   BP 105/65   Pulse 60   Temp (!) 97.1 F (36.2 C) (Temporal)   Ht 5' 4"  (1.626 m)   Wt 182 lb 9.6 oz (82.8 kg)   BMI 31.34 kg/m   General: Well-nourished, well-developed in no acute distress.  Eyes: No icterus. Mouth: Oropharyngeal mucosa moist and pink , no lesions erythema or exudate. Lungs: Clear to auscultation bilaterally.  Heart: Regular rate and rhythm, no murmurs rubs or gallops.  Abdomen: Bowel sounds are normal, nontender, nondistended, no hepatosplenomegaly or masses, no abdominal bruits or hernia , no rebound or guarding.   Extremities: No lower extremity edema. No clubbing or deformities. Neuro: Alert and oriented x 4   Skin: Warm and dry, no jaundice.   Psych: Alert and cooperative, normal mood and affect.

## 2018-10-17 NOTE — Assessment & Plan Note (Signed)
Clinically doing well.  Last colonoscopy June 2019 with mild colitis of the rectum and left colon.  Has had only one flare since we last saw her.  Currently she is taking Apriso 750 mg in the morning.  She is supposed to be on 1.5 g daily.  She had been taking second dose at bedtime but often forgets.  Encouraged taking 1.5 g every morning not only for maintenance of UC but for reduction of colon cancer risk.  Patient agreeable.  We will have her come back to the office in May 2021 in preparation for June 2021 colonoscopy.  Call in the interim with any problems or questions.

## 2018-10-24 ENCOUNTER — Telehealth: Payer: Self-pay | Admitting: Gastroenterology

## 2018-10-24 NOTE — Telephone Encounter (Signed)
Labs dated 08/24/2018 Glucose 93, BUN 11, creatinine 0.79, albumin 3.7, total bilirubin 0.3, alkaline phosphatase 50, AST 13, ALT 8, TSH 3.77, white blood cell count 5400, hemoglobin 13.3, hematocrit 40.2, platelets 304,000

## 2019-07-30 ENCOUNTER — Encounter: Payer: Self-pay | Admitting: Internal Medicine

## 2019-11-22 ENCOUNTER — Encounter: Payer: Self-pay | Admitting: *Deleted

## 2019-11-22 ENCOUNTER — Ambulatory Visit (INDEPENDENT_AMBULATORY_CARE_PROVIDER_SITE_OTHER): Payer: BC Managed Care – PPO | Admitting: *Deleted

## 2019-11-22 VITALS — Ht 64.0 in | Wt 186.2 lb

## 2019-11-22 DIAGNOSIS — Z1211 Encounter for screening for malignant neoplasm of colon: Secondary | ICD-10-CM

## 2019-11-22 MED ORDER — NA SULFATE-K SULFATE-MG SULF 17.5-3.13-1.6 GM/177ML PO SOLN: 1.0000 | Freq: Once | ORAL | 0 refills | Status: AC

## 2019-11-22 NOTE — Patient Instructions (Signed)
Debra Taylor  1966/02/14 MRN: [id number]     Procedure Date: 01/23/2020 Time to register: 8:15 am Place to register: [facility] Procedure Time: 9:15 am Scheduled provider: Dr. Gala Romney    PREPARATION FOR COLONOSCOPY WITH SUPREP BOWEL PREP KIT  Note: Suprep Bowel Prep Kit is a split-dose (2day) regimen. Consumption of BOTH 6-ounce bottles is required for a complete prep.  Please notify us immediately if you are diabetic, take iron supplements, or if you are on Coumadin or any other blood thinners.  Please hold the following medications: n/a                                                                                                                                                  2 DAYS BEFORE PROCEDURE:  DATE: 01/21/2020   DAY: Monday Begin clear liquid diet AFTER your lunch meal. NO SOLID FOODS after this point.  1 DAY BEFORE PROCEDURE:  DATE: 01/22/2020   DAY: Tuesday Continue clear liquids the entire day - NO SOLID FOOD.   Diabetic medications adjustments for today: n/a  At 6:00pm: Complete steps 1 through 4 below, using ONE (1) 6-ounce bottle, before going to bed. Step 1:  Pour ONE (1) 6-ounce bottle of SUPREP liquid into the mixing container.  Step 2:  Add cool drinking water to the 16 ounce line on the container and mix.  Note: Dilute the solution concentrate as directed prior to use. Step 3:  DRINK ALL the liquid in the container. Step 4:  You MUST drink an additional two (2) or more 16 ounce containers of water over the next one (1) hour.   Continue clear liquids.  DAY OF PROCEDURE:   DATE: 01/23/2020   DAY: Wednesday If you take medications for your heart, blood pressure, or breathing, you may take these medications.  Diabetic medications adjustments for today: n/a  5 hours before your procedure at 4:15 am: Step 1:  Pour ONE (1) 6-ounce bottle of SUPREP liquid into the mixing container.  Step 2:  Add cool drinking water to the 16 ounce line on the  container and mix.  Note: Dilute the solution concentrate as directed prior to use. Step 3:  DRINK ALL the liquid in the container. Step 4:  You MUST drink an additional two (2) or more 16 ounce containers of water over the next one (1) hour. You MUST complete the final glass of water at least 3 hours before your colonoscopy. Nothing by mouth past 6:15 am.  You may take your morning medications with sip of water unless we have instructed otherwise.    Please see below for Dietary Information.  CLEAR LIQUIDS INCLUDE:  Water Jello (NOT red in color)   Ice Popsicles (NOT red in color)   Tea (sugar ok, no milk/cream) Powdered fruit flavored drinks  Coffee (sugar ok, no  milk/cream) Gatorade/ Lemonade/ Kool-Aid  (NOT red in color)   Juice: apple, white grape, white cranberry Soft drinks  Clear bullion, consomme, broth (fat free beef/chicken/vegetable)  Carbonated beverages (any kind)  Strained chicken noodle soup Hard Candy   Remember: Clear liquids are liquids that will allow you to see your fingers on the other side of a clear glass. Be sure liquids are NOT red in color, and not cloudy, but CLEAR.  DO NOT EAT OR DRINK ANY OF THE FOLLOWING:  Dairy products of any kind   Cranberry juice Tomato juice / V8 juice   Grapefruit juice Orange juice     Red grape juice  Do not eat any solid foods, including such foods as: cereal, oatmeal, yogurt, fruits, vegetables, creamed soups, eggs, bread, crackers, pureed foods in a blender, etc.   HELPFUL HINTS FOR DRINKING PREP SOLUTION:   Make sure prep is extremely cold. Mix and refrigerate the the morning of the prep. You may also put in the freezer.   You may try mixing some Crystal Light or Country Time Lemonade if you prefer. Mix in small amounts; add more if necessary.  Try drinking through a straw  Rinse mouth with water or a mouthwash between glasses, to remove after-taste.  Try sipping on a cold beverage /ice/ popsicles between glasses of  prep.  Place a piece of sugar-free hard candy in mouth between glasses.  If you become nauseated, try consuming smaller amounts, or stretch out the time between glasses. Stop for 30-60 minutes, then slowly start back drinking.     OTHER INSTRUCTIONS  You will need a responsible adult at least 54 years of age to accompany you and drive you home. This person must remain in the waiting room during your procedure. The hospital will cancel your procedure if you do not have a responsible adult with you.   1. Wear loose fitting clothing that is easily removed. 2. Leave jewelry and other valuables at home.  3. Remove all body piercing jewelry and leave at home. 4. Total time from sign-in until discharge is approximately 2-3 hours. 5. You should go home directly after your procedure and rest. You can resume normal activities the day after your procedure. 6. The day of your procedure you should not:  Drive  Make legal decisions  Operate machinery  Drink alcohol  Return to work   You may call the office (Dept: [phone]) before 5:00pm, or page the doctor on call [phone]) after 5:00pm, for further instructions, if necessary.   Insurance Information YOU WILL NEED TO CHECK WITH YOUR INSURANCE COMPANY FOR THE BENEFITS OF COVERAGE YOU HAVE FOR THIS PROCEDURE.  UNFORTUNATELY, NOT ALL INSURANCE COMPANIES HAVE BENEFITS TO COVER ALL OR PART OF THESE TYPES OF PROCEDURES.  IT IS YOUR RESPONSIBILITY TO CHECK YOUR BENEFITS, HOWEVER, WE WILL BE GLAD TO ASSIST YOU WITH ANY CODES YOUR INSURANCE COMPANY MAY NEED.    PLEASE NOTE THAT MOST INSURANCE COMPANIES WILL NOT COVER A SCREENING COLONOSCOPY FOR PEOPLE UNDER THE AGE OF 50  IF YOU HAVE BCBS INSURANCE, YOU MAY HAVE BENEFITS FOR A SCREENING COLONOSCOPY BUT IF POLYPS ARE FOUND THE DIAGNOSIS WILL CHANGE AND THEN YOU MAY HAVE A DEDUCTIBLE THAT WILL NEED TO BE MET. SO PLEASE MAKE SURE YOU CHECK YOUR BENEFITS FOR A SCREENING COLONOSCOPY AS WELL AS A  DIAGNOSTIC COLONOSCOPY.

## 2019-11-22 NOTE — Progress Notes (Signed)
Pt called back and informed us that she is also taking Protonix 40 mg.  She takes 1 to 2 tablets every 2-3 weeks.  Updated med list.

## 2019-11-22 NOTE — Progress Notes (Signed)
Gastroenterology Pre-Procedure Review  Request Date: 11/22/2019 Requesting Physician: 2 year recall, Last TCS done 09/02/2017 done by Dr. Gala Romney, no polyps, high risk colon cancer surveillance, ulcerative pancolitis (15-20 yr hx)  PATIENT REVIEW QUESTIONS: The patient responded to the following health history questions as indicated:    1. Diabetes Melitis: no 2. Joint replacements in the past 12 months: no 3. Major health problems in the past 3 months: no 4. Has an artificial valve or MVP: no 5. Has a defibrillator: no 6. Has been advised in past to take antibiotics in advance of a procedure like teeth cleaning: no 7. Family history of colon cancer: no  8. Alcohol Use: yes, 1/2 glass of wine weekly 9. Illicit drug Use: no 10. History of sleep apnea: no 11. History of coronary artery or other vascular stents placed within the last 12 months: no 12. History of any prior anesthesia complications: no 13. Body mass index is 31.96 kg/m.    MEDICATIONS & ALLERGIES:    Patient reports the following regarding taking any blood thinners:   Plavix? no Aspirin? no Coumadin? no Brilinta? no Xarelto? no Eliquis? no Pradaxa? no Savaysa? no Effient? no  Patient confirms/reports the following medications:  Current Outpatient Medications  Medication Sig Dispense Refill  . acetaminophen (TYLENOL) 500 MG tablet Take 1,000 mg by mouth as needed for moderate pain.     . Carboxymethylcellul-Glycerin (REFRESH OPTIVE) 1-0.9 % GEL Place 1 drop into both eyes daily as needed (dry eyes).    . cetirizine (ZYRTEC) 10 MG tablet Take 10 mg by mouth daily as needed for allergies.    . cetirizine-pseudoephedrine (ZYRTEC-D) 5-120 MG tablet Take 1 tablet by mouth daily as needed for allergies.    . Cyanocobalamin (B-12) 1000 MCG TABS Take by mouth. Pt chews 2 gummies in the morning.    Marland Kitchen doxycycline (VIBRAMYCIN) 50 MG capsule Take 50 mg by mouth daily.     Marland Kitchen MAXALT-MLT 10 MG disintegrating tablet Take 1 tablet by  mouth as needed.     . mesalamine (APRISO) 0.375 g 24 hr capsule Take 750 mg by mouth daily.     . Multiple Vitamins-Minerals (MULTIVITAMIN WOMEN PO) Take by mouth. Chews 2 gummies in the morning.    . propranolol (INDERAL) 40 MG tablet Take 40 mg by mouth 2 (two) times daily. For migraines    . TRI-SPRINTEC 0.18/0.215/0.25 MG-35 MCG tablet Take 1 tablet by mouth daily.     No current facility-administered medications for this visit.    Patient confirms/reports the following allergies:  No Known Allergies  No orders of the defined types were placed in this encounter.   AUTHORIZATION INFORMATION Primary Insurance: [place],  [place] #: [id number],  Group #: [id number] Pre-Cert / Josem Kaufmann required: No, file to local BCBS  SCHEDULE INFORMATION: Procedure has been scheduled as follows:  Date: 01/23/2020, Time: 9:15 Location: [facility] with Dr. Gala Romney  This Gastroenterology Pre-Precedure Review Form is being routed to the following provider(s): Walden Field, NP

## 2019-12-07 NOTE — Progress Notes (Signed)
Ok to schedule.  ASA I/II

## 2020-01-21 ENCOUNTER — Other Ambulatory Visit: Payer: Self-pay

## 2020-01-21 ENCOUNTER — Other Ambulatory Visit (HOSPITAL_COMMUNITY)
Admission: RE | Admit: 2020-01-21 | Discharge: 2020-01-21 | Disposition: A | Discharge status: Home / Self Care | Payer: BC Managed Care – PPO | Source: Ambulatory Visit | Attending: Internal Medicine | Admitting: Internal Medicine

## 2020-01-21 DIAGNOSIS — Z8719 Personal history of other diseases of the digestive system: Secondary | ICD-10-CM | POA: Diagnosis not present

## 2020-01-21 DIAGNOSIS — Z01812 Encounter for preprocedural laboratory examination: Secondary | ICD-10-CM | POA: Insufficient documentation

## 2020-01-21 DIAGNOSIS — Z79899 Other long term (current) drug therapy: Secondary | ICD-10-CM | POA: Diagnosis not present

## 2020-01-21 DIAGNOSIS — Z20822 Contact with and (suspected) exposure to covid-19: Secondary | ICD-10-CM | POA: Diagnosis not present

## 2020-01-21 DIAGNOSIS — Z792 Long term (current) use of antibiotics: Secondary | ICD-10-CM | POA: Diagnosis not present

## 2020-01-21 DIAGNOSIS — D122 Benign neoplasm of ascending colon: Secondary | ICD-10-CM | POA: Diagnosis not present

## 2020-01-21 DIAGNOSIS — Z1211 Encounter for screening for malignant neoplasm of colon: Secondary | ICD-10-CM | POA: Diagnosis not present

## 2020-01-22 LAB — SARS CORONAVIRUS 2 (TAT 6-24 HRS): SARS Coronavirus 2: NEGATIVE

## 2020-01-23 ENCOUNTER — Encounter (HOSPITAL_COMMUNITY): Payer: Self-pay | Admitting: Internal Medicine

## 2020-01-23 ENCOUNTER — Ambulatory Visit (HOSPITAL_COMMUNITY)
Admission: RE | Admit: 2020-01-23 | Discharge: 2020-01-23 | Disposition: A | Discharge status: Home / Self Care | Payer: BC Managed Care – PPO | Attending: Internal Medicine | Admitting: Internal Medicine

## 2020-01-23 ENCOUNTER — Encounter (HOSPITAL_COMMUNITY)
Admission: RE | Disposition: A | Discharge status: Home / Self Care | Payer: Self-pay | Source: Home / Self Care | Attending: Internal Medicine

## 2020-01-23 ENCOUNTER — Other Ambulatory Visit: Payer: Self-pay

## 2020-01-23 DIAGNOSIS — Z1211 Encounter for screening for malignant neoplasm of colon: Secondary | ICD-10-CM | POA: Insufficient documentation

## 2020-01-23 DIAGNOSIS — K5100 Ulcerative (chronic) pancolitis without complications: Secondary | ICD-10-CM

## 2020-01-23 DIAGNOSIS — Z8719 Personal history of other diseases of the digestive system: Secondary | ICD-10-CM | POA: Insufficient documentation

## 2020-01-23 DIAGNOSIS — K635 Polyp of colon: Secondary | ICD-10-CM

## 2020-01-23 DIAGNOSIS — D122 Benign neoplasm of ascending colon: Secondary | ICD-10-CM | POA: Insufficient documentation

## 2020-01-23 DIAGNOSIS — Z79899 Other long term (current) drug therapy: Secondary | ICD-10-CM | POA: Insufficient documentation

## 2020-01-23 DIAGNOSIS — Z792 Long term (current) use of antibiotics: Secondary | ICD-10-CM | POA: Insufficient documentation

## 2020-01-23 DIAGNOSIS — Z20822 Contact with and (suspected) exposure to covid-19: Secondary | ICD-10-CM | POA: Insufficient documentation

## 2020-01-23 HISTORY — PX: COLONOSCOPY: SHX5424

## 2020-01-23 HISTORY — PX: BIOPSY: SHX5522

## 2020-01-23 HISTORY — PX: POLYPECTOMY: SHX5525

## 2020-01-23 LAB — BASIC METABOLIC PANEL
Anion gap: 6 (ref 5–15)
BUN: 11 mg/dL (ref 6–20)
CO2: 26 mmol/L (ref 22–32)
Calcium: 8 mg/dL — ABNORMAL LOW (ref 8.9–10.3)
Chloride: 105 mmol/L (ref 98–111)
Creatinine, Ser: 0.91 mg/dL (ref 0.44–1.00)
GFR, Estimated: >60 mL/min (ref >60)
Glucose, Bld: 86 mg/dL (ref 70–99)
Potassium: 4.1 mmol/L (ref 3.5–5.1)
Sodium: 137 mmol/L (ref 135–145)

## 2020-01-23 SURGERY — COLONOSCOPY
Anesthesia: Moderate Sedation

## 2020-01-23 MED ORDER — MIDAZOLAM HCL 5 MG/5ML IJ SOLN
INTRAMUSCULAR | Status: DC | PRN
  Administered 2020-01-23: 1 mg via INTRAVENOUS
  Administered 2020-01-23: 2 mg via INTRAVENOUS
  Administered 2020-01-23: 1 mg via INTRAVENOUS
  Administered 2020-01-23 (×2): 2 mg via INTRAVENOUS
  Administered 2020-01-23: 1 mg via INTRAVENOUS

## 2020-01-23 MED ORDER — SODIUM CHLORIDE 0.9 % IV SOLN
INTRAVENOUS | Status: DC
  Administered 2020-01-23: 1000 mL via INTRAVENOUS

## 2020-01-23 MED ORDER — MEPERIDINE HCL 100 MG/ML IJ SOLN
INTRAMUSCULAR | Status: DC | PRN
  Administered 2020-01-23: 25 mg
  Administered 2020-01-23: 15 mg

## 2020-01-23 MED ORDER — ONDANSETRON HCL 4 MG/2ML IJ SOLN
INTRAMUSCULAR | Status: DC | PRN
  Administered 2020-01-23: 4 mg via INTRAVENOUS

## 2020-01-23 MED ORDER — MEPERIDINE HCL 50 MG/ML IJ SOLN
INTRAMUSCULAR | Status: AC
  Filled 2020-01-23: qty 1

## 2020-01-23 MED ORDER — STERILE WATER FOR IRRIGATION IR SOLN
Status: DC | PRN
  Administered 2020-01-23: 15 mL

## 2020-01-23 MED ORDER — MIDAZOLAM HCL 5 MG/5ML IJ SOLN
INTRAMUSCULAR | Status: AC
  Filled 2020-01-23: qty 10

## 2020-01-23 MED ORDER — ONDANSETRON HCL 4 MG/2ML IJ SOLN
INTRAMUSCULAR | Status: AC
  Filled 2020-01-23: qty 2

## 2020-01-23 NOTE — OR Nursing (Signed)
Debra Taylor had a procedure at [facility] on 01/23/20 and cannot return to work until 11:00am on 01/24/20.

## 2020-01-23 NOTE — Op Note (Signed)
[facility] Patient Name: Debra Taylor Procedure Date: 01/23/2020 9:22 AM MRN: [id number] Date of Birth: 01-05-1966 Attending MD: Norvel Richards , MD CSN: [id number] Age: 54 Admit Type: Outpatient Procedure:                Colonoscopy Indications:              High risk colon cancer surveillance: Ulcerative                            pancolitis of 8 (or more) years duration Providers:                Norvel Richards, MD, Janeece Riggers, RN, Raphael Gibney, Technician Referring MD:              Medicines:                Midazolam 9 mg IV, Meperidine 50 mg IV Complications:            No immediate complications. Estimated Blood Loss:     Estimated blood loss was minimal. Procedure:                Pre-Anesthesia Assessment:                           - Prior to the procedure, a History and Physical                            was performed, and patient medications and                            allergies were reviewed. The patient's tolerance of                            previous anesthesia was also reviewed. The risks                            and benefits of the procedure and the sedation                            options and risks were discussed with the patient.                            All questions were answered, and informed consent                            was obtained. Prior Anticoagulants: The patient has                            taken no previous anticoagulant or antiplatelet                            agents. ASA Grade Assessment: II - A patient with  mild systemic disease. After reviewing the risks                            and benefits, the patient was deemed in                            satisfactory condition to undergo the procedure.                           After obtaining informed consent, the colonoscope                            was passed under direct vision. Throughout the                             procedure, the patient's blood pressure, pulse, and                            oxygen saturations were monitored continuously. The                            CF-HQ190L ([id number]) scope was introduced through                            the anus and advanced to the the cecum, identified                            by appendiceal orifice and ileocecal valve. The                            colonoscopy was performed without difficulty. The                            patient tolerated the procedure well. The quality                            of the bowel preparation was adequate. Scope In: 9:24:21 AM Scope Out: 9:42:35 AM Scope Withdrawal Time: 0 hours 13 minutes 14 seconds  Total Procedure Duration: 0 hours 18 minutes 14 seconds  Findings:      The perianal and digital rectal examinations were normal. Slightly       diffusely pale rectal and colonic mucosa all the way to the cecum. No       ulcers or erosions. No granularity. Vascular pattern preserved       throughout. Frequent biopsies of the ascending, transverse, descending,       sigmoid and rectal segments taken for histologic examination.      A 5 mm polyp was found in the ascending colon. The polyp was sessile.       Estimated blood loss was minimal.      The exam was otherwise without abnormality on direct and retroflexion       views. Impression:               - One 5 mm polyp in the ascending colon. Minimally  abnormal rectal and colonic mucosa as described                            above consistent with quiescent procto-colitis                           - The examination was otherwise normal on direct                            and retroflexion views. Moderate Sedation:      Moderate (conscious) sedation was administered by the endoscopy nurse       and supervised by the endoscopist. The following parameters were       monitored: oxygen saturation, heart rate, blood pressure, respiratory        rate, EKG, adequacy of pulmonary ventilation, and response to care.       Total physician intraservice time was 29 minutes. Recommendation:           - Patient has a contact number available for                            emergencies. The signs and symptoms of potential                            delayed complications were discussed with the                            patient. Return to normal activities tomorrow.                            Written discharge instructions were provided to the                            patient.                           - Resume previous diet.                           - Continue present medications (Apriso once daily).                            Follow-up on pathology.                           - Repeat colonoscopy date to be determined after                            pending pathology results are reviewed for                            surveillance.                           - Return to GI office (date not yet determined). Procedure Code(s):        --- Professional ---  [id number], Colonoscopy, flexible; diagnostic, including                            collection of specimen(s) by brushing or washing,                            when performed (separate procedure)                           99153, Moderate sedation; each additional 15                            minutes intraservice time                           G0500, Moderate sedation services provided by the                            same physician or other qualified health care                            professional performing a gastrointestinal                            endoscopic service that sedation supports,                            requiring the presence of an independent trained                            observer to assist in the monitoring of the                            patient's level of consciousness and physiological                            status; initial  15 minutes of intra-service time;                            patient age 53 years or older (additional time may                            be reported with [phone], as appropriate) Diagnosis Code(s):        --- Professional ---                           K51.00, Ulcerative (chronic) pancolitis without                            complications                           K63.5, Polyp of colon CPT copyright 2019 American Medical Association. All rights reserved. The codes documented in this report are preliminary and upon coder review may  be revised to meet current compliance requirements. Herbie Baltimore  Hilton Cork, MD Norvel Richards, MD 01/23/2020 9:48:37 AM This report has been signed electronically. Number of Addenda: 0

## 2020-01-23 NOTE — H&P (Signed)
@LOGO @   Primary Care Physician:  Caryl Bis, MD Primary Gastroenterologist:  Dr. Gala Romney  Pre-Procedure History & Physical: HPI:  Debra Taylor is a 54 y.o. female here for surveillance colonoscopy.  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  Historically, well controlled on Apriso for several years.  She is due for updated labs.  Serum creatinine normal 2018.  Only taking Apriso once in the morning  Past Medical History:  Diagnosis Date  . Chest pain   . Depression   . Hyperlipidemia   . Migraine headache   . Premenopausal patient    Conctraception  . Ulcerative colitis    Onset at age 8    Past Surgical History:  Procedure Laterality Date  . BIOPSY  09/02/2017   Procedure: BIOPSY;  Surgeon: Daneil Dolin, MD;  Location: [facility];  Service: Endoscopy;;  ascending colon, transverse,  . COLONOSCOPY  08/2009   normal terminal ileum/inflammatory changes of the rectum and colon with panproctocolitis. No dysplasia on bx.   . COLONOSCOPY  09/16/2011   RMR: Findings/Biopsies consistent with chronically mild active pan ulcerative colitis-endoscopically mild appearing. Status post segmental biopsy  . COLONOSCOPY N/A 08/30/2013   RMR: Endoscopically active distal proctocolitis, comparable biopsies. No dysplasia.  . COLONOSCOPY N/A 08/28/2015   Procedure: COLONOSCOPY;  Surgeon: Daneil Dolin, MD;  Location: [facility];  Service: Endoscopy;  Laterality: N/A;  1215-moved to 1300  . COLONOSCOPY N/A 09/02/2017   Procedure: COLONOSCOPY;  Surgeon: Daneil Dolin, MD;  Location: [facility];  Service: Endoscopy;  Laterality: N/A;  12:00-rescheduled to 6/28 @ 7:30 per Almyra Free  . DENTAL SURGERY      Prior to Admission medications   Medication Sig Start Date End Date Taking? Authorizing Provider  acetaminophen (TYLENOL) 500 MG tablet Take 1,000 mg by mouth every 8 (eight) hours as needed for moderate pain.    Yes [provider]  Carboxymethylcellul-Glycerin  (REFRESH OPTIVE) 1-0.9 % GEL Place 1 drop into both eyes daily as needed (dry eyes).   Yes [provider]  cetirizine (ZYRTEC) 10 MG tablet Take 10 mg by mouth daily as needed for allergies.   Yes [provider]  cetirizine-pseudoephedrine (ZYRTEC-D) 5-120 MG tablet Take 1 tablet by mouth daily as needed for allergies.   Yes [provider]  Cyanocobalamin (B-12) 1000 MCG TABS Take 2 tablets by mouth daily. Pt chews 2 gummies in the morning.    Yes [provider]  doxycycline (VIBRAMYCIN) 50 MG capsule Take 50 mg by mouth daily.  07/24/15  Yes [provider]  MAXALT-MLT 10 MG disintegrating tablet Take 1 tablet by mouth as needed for migraine.  09/18/18  Yes [provider]  mesalamine (APRISO) 0.375 g 24 hr capsule Take 750 mg by mouth daily.    Yes [provider]  Multiple Vitamins-Minerals (MULTI-VITAMIN GUMMIES) CHEW Chew 2 tablets by mouth daily.   Yes [provider]  pantoprazole (PROTONIX) 40 MG tablet Take 40 mg by mouth daily as needed (heartburn).    Yes [provider]  propranolol (INDERAL) 40 MG tablet Take 40 mg by mouth 2 (two) times daily. For migraines 06/02/16  Yes [provider]  TRI-SPRINTEC 0.18/0.215/0.25 MG-35 MCG tablet Take 1 tablet by mouth daily. 08/11/16  Yes [provider]    Allergies as of 12/10/2019  . (No Known Allergies)    Family History  Problem Relation Age of Onset  . Heart attack Father   . Lung disease  Father   . Uterine cancer Mother   . Ovarian cancer Mother   . Heart attack Brother   . Colon cancer Neg Hx   . Breast cancer Neg Hx   . Lung cancer Neg Hx     Social History   Socioeconomic History  . Marital status: Married    Spouse name: Not on file  . Number of children: 0  . Years of education: Not on file  . Highest education level: Not on file  Occupational History  . Occupation: [employer]  Tobacco Use  . Smoking status: Never Smoker  . Smokeless tobacco: Never Used  Vaping Use  . Vaping Use: Never used  Substance and Sexual Activity  . Alcohol use: Yes    Comment: Occasional  . Drug use: No  . Sexual activity: Not on file  Other Topics Concern  . Not on file  Social History Narrative   Married, no children   Social Determinants of Health   Financial Resource Strain:   . Difficulty of Paying Living Expenses: Not on file  Food Insecurity:   . Worried About Charity fundraiser in the Last Year: Not on file  . Ran Out of Food in the Last Year: Not on file  Transportation Needs:   . Lack of Transportation (Medical): Not on file  . Lack of Transportation (Non-Medical): Not on file  Physical Activity:   . Days of Exercise per Week: Not on file  . Minutes of Exercise per Session: Not on file  Stress:   . Feeling of Stress : Not on file  Social Connections:   . Frequency of Communication with Friends and Family: Not on file  . Frequency of Social Gatherings with Friends and Family: Not on file  . Attends Religious Services: Not on file  . Active Member of Clubs or Organizations: Not on file  . Attends Archivist Meetings: Not on file  . Marital Status: Not on file  Intimate Partner Violence:   . Fear of Current or Ex-Partner: Not on file  . Emotionally Abused: Not on file  . Physically Abused: Not on file  . Sexually Abused: Not on file    Review of Systems: See HPI, otherwise negative ROS  Physical Exam: BP 123/77   Pulse 62   Temp 98.5 F (36.9 C) (Oral)   Resp 16   LMP 12/15/2019 (Exact Date)   SpO2 100%  General:   Alert,   well-nourished, pleasant and cooperative in NAD Neck:  Supple; no masses or thyromegaly. No significant cervical adenopathy. Lungs:  Clear throughout to auscultation.   No wheezes, crackles, or rhonchi. No acute distress. Heart:  Regular rate and rhythm; no murmurs, clicks, rubs,  or  gallops. Abdomen: Non-distended, normal bowel sounds.  Soft and nontender without appreciable mass or hepatosplenomegaly.  Pulses:  Normal pulses noted. Extremities:  Without clubbing or edema.  Impression/Plan: 54 year old lady with a greater than 20-year history of ulcerative colitis.  Historically, has been in remission for several years taking low-dose Apriso at this time.  Per recommendations, she is here for surveillance colonoscopy. The risks, benefits, limitations, alternatives and imponderables have been reviewed with the patient. Questions have been answered. All parties are agreeable.      Notice: This dictation was prepared with Dragon dictation along with smaller phrase technology. Any transcriptional errors that result from this process are unintentional and may not be corrected upon  review.

## 2020-01-23 NOTE — Discharge Instructions (Signed)
Colonoscopy Discharge Instructions  Read the instructions outlined below and refer to this sheet in the next few weeks. These discharge instructions provide you with general information on caring for yourself after you leave the hospital. Your doctor may also give you specific instructions. While your treatment has been planned according to the most current medical practices available, unavoidable complications occasionally occur. If you have any problems or questions after discharge, call Dr. Gala Romney at [phone]. ACTIVITY  You may resume your regular activity, but move at a slower pace for the next 24 hours.   Take frequent rest periods for the next 24 hours.   Walking will help get rid of the air and reduce the bloated feeling in your belly (abdomen).   No driving for 24 hours (because of the medicine (anesthesia) used during the test).    Do not sign any important legal documents or operate any machinery for 24 hours (because of the anesthesia used during the test).  NUTRITION  Drink plenty of fluids.   You may resume your normal diet as instructed by your doctor.   Begin with a light meal and progress to your normal diet. Heavy or fried foods are harder to digest and may make you feel sick to your stomach (nauseated).   Avoid alcoholic beverages for 24 hours or as instructed.  MEDICATIONS  You may resume your normal medications unless your doctor tells you otherwise.  WHAT YOU CAN EXPECT TODAY  Some feelings of bloating in the abdomen.   Passage of more gas than usual.   Spotting of blood in your stool or on the toilet paper.  IF YOU HAD POLYPS REMOVED DURING THE COLONOSCOPY:  No aspirin products for 7 days or as instructed.   No alcohol for 7 days or as instructed.   Eat a soft diet for the next 24 hours.  FINDING OUT THE RESULTS OF YOUR TEST Not all test results are available during your visit. If your test results are not back during the visit, make an appointment  with your caregiver to find out the results. Do not assume everything is normal if you have not heard from your caregiver or the medical facility. It is important for you to follow up on all of your test results.  SEEK IMMEDIATE MEDICAL ATTENTION IF:  You have more than a spotting of blood in your stool.   Your belly is swollen (abdominal distention).   You are nauseated or vomiting.   You have a temperature over 101.   You have abdominal pain or discomfort that is severe or gets worse throughout the day.    Your colitis appeared inactive today.  Biopsies taken.  1 small polyp removed  Further recommendations to follow pending review of pathology report  For now continue Apriso 1 tablet each morning  We will also check a basic metabolic profile to assure that your kidney function remains normal  At patient request, I called Joe at [phone] -reviewed findings and recommendations   Colon Polyps  Polyps are tissue growths inside the body. Polyps can grow in many places, including the large intestine (colon). A polyp may be a round bump or a mushroom-shaped growth. You could have one polyp or several. Most colon polyps are noncancerous (benign). However, some colon polyps can become cancerous over time. Finding and removing the polyps early can help prevent this. What are the causes? The exact cause of colon polyps is not known. What increases the risk? You are more likely to  develop this condition if you:  Have a family history of colon cancer or colon polyps.  Are older than 26 or older than 45 if you are African American.  Have inflammatory bowel disease, such as ulcerative colitis or Crohn's disease.  Have certain hereditary conditions, such as: ? Familial adenomatous polyposis. ? Lynch syndrome. ? Turcot syndrome. ? Peutz-Jeghers syndrome.  Are overweight.  Smoke cigarettes.  Do not get enough exercise.  Drink too much alcohol.  Eat a diet that is high in  fat and red meat and low in fiber.  Had childhood cancer that was treated with abdominal radiation. What are the signs or symptoms? Most polyps do not cause symptoms. If you have symptoms, they may include:  Blood coming from your rectum when having a bowel movement.  Blood in your stool. The stool may look dark red or black.  Abdominal pain.  A change in bowel habits, such as constipation or diarrhea. How is this diagnosed? This condition is diagnosed with a colonoscopy. This is a procedure in which a lighted, flexible scope is inserted into the anus and then passed into the colon to examine the area. Polyps are sometimes found when a colonoscopy is done as part of routine cancer screening tests. How is this treated? Treatment for this condition involves removing any polyps that are found. Most polyps can be removed during a colonoscopy. Those polyps will then be tested for cancer. Additional treatment may be needed depending on the results of testing. Follow these instructions at home: Lifestyle  Maintain a healthy weight, or lose weight if recommended by your health care provider.  Exercise every day or as told by your health care provider.  Do not use any products that contain nicotine or tobacco, such as cigarettes and e-cigarettes. If you need help quitting, ask your health care provider.  If you drink alcohol, limit how much you have: ? 0-1 drink a day for women. ? 0-2 drinks a day for men.  Be aware of how much alcohol is in your drink. In the [place] one drink equals one 12 oz bottle of beer (355 mL), one 5 oz glass of wine (148 mL), or one 1 oz shot of hard liquor (44 mL). Eating and drinking   Eat foods that are high in fiber, such as fruits, vegetables, and whole grains.  Eat foods that are high in calcium and vitamin D, such as milk, cheese, yogurt, eggs, liver, fish, and broccoli.  Limit foods that are high in fat, such as fried foods and desserts.  Limit the  amount of red meat and processed meat you eat, such as hot dogs, sausage, bacon, and lunch meats. General instructions  Keep all follow-up visits as told by your health care provider. This is important. ? This includes having regularly scheduled colonoscopies. ? Talk to your health care provider about when you need a colonoscopy. Contact a health care provider if:  You have new or worsening bleeding during a bowel movement.  You have new or increased blood in your stool.  You have a change in bowel habits.  You lose weight for no known reason. Summary  Polyps are tissue growths inside the body. Polyps can grow in many places, including the colon.  Most colon polyps are noncancerous (benign), but some can become cancerous over time.  This condition is diagnosed with a colonoscopy.  Treatment for this condition involves removing any polyps that are found. Most polyps can be removed during a colonoscopy. This  information is not intended to replace advice given to you by your health care provider. Make sure you discuss any questions you have with your health care provider. Document Revised: 06/09/2017 Document Reviewed: 06/09/2017 Elsevier Patient Education  [place].

## 2020-01-24 LAB — SURGICAL PATHOLOGY

## 2020-01-25 ENCOUNTER — Encounter: Payer: Self-pay | Admitting: Internal Medicine

## 2020-01-28 ENCOUNTER — Encounter (HOSPITAL_COMMUNITY): Payer: Self-pay | Admitting: Internal Medicine

## 2020-01-28 ENCOUNTER — Other Ambulatory Visit (HOSPITAL_COMMUNITY): Payer: BC Managed Care – PPO
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
## 2020-10-17 ENCOUNTER — Ambulatory Visit
Admission: EM | Admit: 2020-10-17 | Discharge: 2020-10-17 | Disposition: A | Discharge status: Home / Self Care | Payer: BC Managed Care – PPO | Attending: Emergency Medicine | Admitting: Emergency Medicine

## 2020-10-17 ENCOUNTER — Other Ambulatory Visit: Payer: Self-pay

## 2020-10-17 DIAGNOSIS — Z7721 Contact with and (suspected) exposure to potentially hazardous body fluids: Secondary | ICD-10-CM

## 2020-10-17 DIAGNOSIS — W461XXA Contact with contaminated hypodermic needle, initial encounter: Secondary | ICD-10-CM | POA: Diagnosis not present

## 2020-10-17 NOTE — Discharge Instructions (Addendum)
Blood work ordered We will follow up with you regarding abnormal results

## 2020-10-17 NOTE — ED Triage Notes (Signed)
Pt was picking up trash earlier and was stuck with medical needle unsure what size

## 2020-10-17 NOTE — ED Provider Notes (Signed)
[place]   [id number] 10/17/20 Arrival Time: 1559   CC: Needle stick  SUBJECTIVE:  Debra Taylor is a 55 y.o. female who presents with a needle stick to front of LT knee that occurred earlier today as she picking up trash.  Denies previous symptoms in the past.  Denies fever, chills, nausea, vomiting, redness, swelling, bleeding.    ROS: As per HPI.  All other pertinent ROS negative.     Past Medical History:  Diagnosis Date   Chest pain    Depression    Hyperlipidemia    Migraine headache    Premenopausal patient    Conctraception   Ulcerative colitis    Onset at age 61   Past Surgical History:  Procedure Laterality Date   BIOPSY  09/02/2017   Procedure: BIOPSY;  Surgeon: Daneil Dolin, MD;  [facility];  Service: Endoscopy;;  ascending colon, transverse,   BIOPSY  01/23/2020   Procedure: BIOPSY;  Surgeon: Daneil Dolin, MD;  [facility];  Service: Endoscopy;;   COLONOSCOPY  08/2009   normal terminal ileum/inflammatory changes of the rectum and colon with panproctocolitis. No dysplasia on bx.    COLONOSCOPY  09/16/2011   RMR: Findings/Biopsies consistent with chronically mild active pan ulcerative colitis-endoscopically mild appearing. Status post segmental biopsy   COLONOSCOPY N/A 08/30/2013   RMR: Endoscopically active distal proctocolitis, comparable biopsies. No dysplasia.   COLONOSCOPY N/A 08/28/2015   Procedure: COLONOSCOPY;  Surgeon: Daneil Dolin, MD;  [facility];  Service: Endoscopy;  Laterality: N/A;  1215-moved to 1300   COLONOSCOPY N/A 09/02/2017   Procedure: COLONOSCOPY;  Surgeon: Daneil Dolin, MD;  [facility];  Service: Endoscopy;  Laterality: N/A;  12:00-rescheduled to 6/28 @ 7:30 per Almyra Free   COLONOSCOPY N/A 01/23/2020   Procedure: COLONOSCOPY;  Surgeon: Daneil Dolin, MD;  [facility];  Service: Endoscopy;  Laterality: N/A;  9:15   DENTAL SURGERY     POLYPECTOMY  01/23/2020    Procedure: POLYPECTOMY;  Surgeon: Daneil Dolin, MD;  [facility];  Service: Endoscopy;;   No Known Allergies No current facility-administered medications on file prior to encounter.   Current Outpatient Medications on File Prior to Encounter  Medication Sig Dispense Refill   acetaminophen (TYLENOL) 500 MG tablet Take 1,000 mg by mouth every 8 (eight) hours as needed for moderate pain.      Carboxymethylcellul-Glycerin (REFRESH OPTIVE) 1-0.9 % GEL Place 1 drop into both eyes daily as needed (dry eyes).     cetirizine (ZYRTEC) 10 MG tablet Take 10 mg by mouth daily as needed for allergies.     cetirizine-pseudoephedrine (ZYRTEC-D) 5-120 MG tablet Take 1 tablet by mouth daily as needed for allergies.     Cyanocobalamin (B-12) 1000 MCG TABS Take 2 tablets by mouth daily. Pt chews 2 gummies in the morning.      doxycycline (VIBRAMYCIN) 50 MG capsule Take 50 mg by mouth daily.      MAXALT-MLT 10 MG disintegrating tablet Take 1 tablet by mouth as needed for migraine.      mesalamine (APRISO) 0.375 g 24 hr capsule Take 750 mg by mouth daily.      Multiple Vitamins-Minerals (MULTI-VITAMIN GUMMIES) CHEW Chew 2 tablets by mouth daily.     pantoprazole (PROTONIX) 40 MG tablet Take 40 mg by mouth daily as needed (heartburn).      propranolol (INDERAL) 40 MG tablet Take 40 mg by mouth 2 (  two) times daily. For migraines     TRI-SPRINTEC 0.18/0.215/0.25 MG-35 MCG tablet Take 1 tablet by mouth daily.     Social History   Socioeconomic History   Marital status: Married    Spouse name: Not on file   Number of children: 0   Years of education: Not on file   Highest education level: Not on file  Occupational History   Occupation: [employer]    Employer: [employer]  Tobacco Use   Smoking status: Never   Smokeless tobacco: Never  Vaping Use   Vaping Use: Never used  Substance and Sexual Activity   Alcohol use: Yes    Comment: Occasional    Drug use: No   Sexual activity: Not on file  Other Topics Concern   Not on file  Social History Narrative   Married, no children   Social Determinants of Health   Financial Resource Strain: Not on file  Food Insecurity: Not on file  Transportation Needs: Not on file  Physical Activity: Not on file  Stress: Not on file  Social Connections: Not on file  Intimate Partner Violence: Not on file   Family History  Problem Relation Age of Onset   Heart attack Father    Lung disease Father    Uterine cancer Mother    Ovarian cancer Mother    Heart attack Brother    Colon cancer Neg Hx    Breast cancer Neg Hx    Lung cancer Neg Hx     OBJECTIVE:  Vitals:   10/17/20 1700  BP: (!) 154/66  Pulse: 63  Resp: 20  Temp: 98.3 F (36.8 C)  SpO2: 99%     General appearance: alert; no distress Skin: intact, no puncture wound evident to anterior LT knee Psychological: alert and cooperative; normal mood and affect  ASSESSMENT & PLAN:  1. Exposure to body fluids by contaminated hypodermic needle stick    Blood work ordered We will follow up with you regarding abnormal results  Reviewed expectations re: course of current medical issues. Questions answered. Outlined signs and symptoms indicating need for more acute intervention. Patient verbalized understanding. After Visit Summary given.           Lestine Box, PA-C 10/17/20 1742

## 2020-10-18 LAB — RPR: RPR Ser Ql: NONREACTIVE

## 2020-10-18 LAB — COMPREHENSIVE METABOLIC PANEL
ALT: 8 IU/L (ref 0–32)
AST: 14 IU/L (ref 0–40)
Albumin/Globulin Ratio: 1.2 (ref 1.2–2.2)
Albumin: 3.8 g/dL (ref 3.8–4.9)
Alkaline Phosphatase: 65 IU/L (ref 44–121)
BUN/Creatinine Ratio: 15 (ref 9–23)
BUN: 12 mg/dL (ref 6–24)
Bilirubin Total: <0.2 mg/dL (ref 0.0–1.2)
CO2: 21 mmol/L (ref 20–29)
Calcium: 8.8 mg/dL (ref 8.7–10.2)
Chloride: 107 mmol/L — ABNORMAL HIGH (ref 96–106)
Creatinine, Ser: 0.78 mg/dL (ref 0.57–1.00)
Globulin, Total: 3.2 g/dL (ref 1.5–4.5)
Glucose: 107 mg/dL — ABNORMAL HIGH (ref 65–99)
Potassium: 4.4 mmol/L (ref 3.5–5.2)
Sodium: 143 mmol/L (ref 134–144)
Total Protein: 7 g/dL (ref 6.0–8.5)
eGFR: 90 mL/min/{1.73_m2} (ref >59)

## 2020-10-18 LAB — HEPATITIS C ANTIBODY: Hep C Virus Ab: <0.1 s/co ratio (ref 0.0–0.9)

## 2020-10-18 LAB — HEPATITIS B SURFACE ANTIGEN: Hepatitis B Surface Ag: NEGATIVE

## 2020-10-24 ENCOUNTER — Ambulatory Visit
Admission: EM | Admit: 2020-10-24 | Discharge: 2020-10-24 | Disposition: A | Discharge status: Home / Self Care | Payer: BC Managed Care – PPO | Attending: Emergency Medicine | Admitting: Emergency Medicine

## 2020-10-24 ENCOUNTER — Other Ambulatory Visit: Payer: Self-pay

## 2020-10-24 DIAGNOSIS — Z7721 Contact with and (suspected) exposure to potentially hazardous body fluids: Secondary | ICD-10-CM | POA: Diagnosis not present

## 2020-10-24 DIAGNOSIS — W461XXA Contact with contaminated hypodermic needle, initial encounter: Secondary | ICD-10-CM | POA: Diagnosis not present

## 2020-10-24 NOTE — ED Triage Notes (Signed)
Pt here for re draw of blood work

## 2020-10-25 LAB — HIV ANTIBODY (ROUTINE TESTING W REFLEX): HIV Screen 4th Generation wRfx: NONREACTIVE

## 2021-02-23 DIAGNOSIS — Z1231 Encounter for screening mammogram for malignant neoplasm of breast: Secondary | ICD-10-CM | POA: Diagnosis not present

## 2021-03-19 DIAGNOSIS — R922 Inconclusive mammogram: Secondary | ICD-10-CM | POA: Diagnosis not present

## 2021-03-19 DIAGNOSIS — R928 Other abnormal and inconclusive findings on diagnostic imaging of breast: Secondary | ICD-10-CM | POA: Diagnosis not present

## 2021-03-24 DIAGNOSIS — E782 Mixed hyperlipidemia: Secondary | ICD-10-CM | POA: Diagnosis not present

## 2021-03-24 DIAGNOSIS — K5100 Ulcerative (chronic) pancolitis without complications: Secondary | ICD-10-CM | POA: Diagnosis not present

## 2021-03-24 DIAGNOSIS — Z1329 Encounter for screening for other suspected endocrine disorder: Secondary | ICD-10-CM | POA: Diagnosis not present

## 2021-03-25 DIAGNOSIS — Z01419 Encounter for gynecological examination (general) (routine) without abnormal findings: Secondary | ICD-10-CM | POA: Diagnosis not present

## 2021-03-25 DIAGNOSIS — N958 Other specified menopausal and perimenopausal disorders: Secondary | ICD-10-CM | POA: Diagnosis not present

## 2021-03-25 DIAGNOSIS — Z6830 Body mass index (BMI) 30.0-30.9, adult: Secondary | ICD-10-CM | POA: Diagnosis not present

## 2021-03-25 DIAGNOSIS — Z8041 Family history of malignant neoplasm of ovary: Secondary | ICD-10-CM | POA: Diagnosis not present

## 2021-03-31 DIAGNOSIS — L719 Rosacea, unspecified: Secondary | ICD-10-CM | POA: Diagnosis not present

## 2021-03-31 DIAGNOSIS — K5100 Ulcerative (chronic) pancolitis without complications: Secondary | ICD-10-CM | POA: Diagnosis not present

## 2021-03-31 DIAGNOSIS — G43909 Migraine, unspecified, not intractable, without status migrainosus: Secondary | ICD-10-CM | POA: Diagnosis not present

## 2021-03-31 DIAGNOSIS — E7849 Other hyperlipidemia: Secondary | ICD-10-CM | POA: Diagnosis not present

## 2021-04-21 DIAGNOSIS — B351 Tinea unguium: Secondary | ICD-10-CM | POA: Diagnosis not present

## 2021-04-28 DIAGNOSIS — Z7721 Contact with and (suspected) exposure to potentially hazardous body fluids: Secondary | ICD-10-CM | POA: Diagnosis not present

## 2021-04-28 DIAGNOSIS — D485 Neoplasm of uncertain behavior of skin: Secondary | ICD-10-CM | POA: Diagnosis not present

## 2021-04-28 DIAGNOSIS — D225 Melanocytic nevi of trunk: Secondary | ICD-10-CM | POA: Diagnosis not present

## 2021-04-28 DIAGNOSIS — L719 Rosacea, unspecified: Secondary | ICD-10-CM | POA: Diagnosis not present

## 2021-04-28 DIAGNOSIS — W461XXA Contact with contaminated hypodermic needle, initial encounter: Secondary | ICD-10-CM | POA: Diagnosis not present

## 2021-08-18 DIAGNOSIS — M79676 Pain in unspecified toe(s): Secondary | ICD-10-CM | POA: Diagnosis not present

## 2021-08-18 DIAGNOSIS — B351 Tinea unguium: Secondary | ICD-10-CM | POA: Diagnosis not present

## 2021-09-25 DIAGNOSIS — R5382 Chronic fatigue, unspecified: Secondary | ICD-10-CM | POA: Diagnosis not present

## 2021-09-25 DIAGNOSIS — E7849 Other hyperlipidemia: Secondary | ICD-10-CM | POA: Diagnosis not present

## 2021-09-29 DIAGNOSIS — L719 Rosacea, unspecified: Secondary | ICD-10-CM | POA: Diagnosis not present

## 2021-09-29 DIAGNOSIS — K5100 Ulcerative (chronic) pancolitis without complications: Secondary | ICD-10-CM | POA: Diagnosis not present

## 2021-09-29 DIAGNOSIS — G43909 Migraine, unspecified, not intractable, without status migrainosus: Secondary | ICD-10-CM | POA: Diagnosis not present

## 2021-09-29 DIAGNOSIS — R079 Chest pain, unspecified: Secondary | ICD-10-CM | POA: Diagnosis not present

## 2021-09-29 DIAGNOSIS — E7849 Other hyperlipidemia: Secondary | ICD-10-CM | POA: Diagnosis not present

## 2021-10-06 DIAGNOSIS — R079 Chest pain, unspecified: Secondary | ICD-10-CM | POA: Diagnosis not present

## 2021-10-27 DIAGNOSIS — L719 Rosacea, unspecified: Secondary | ICD-10-CM | POA: Diagnosis not present

## 2022-01-05 ENCOUNTER — Encounter: Payer: Self-pay | Admitting: *Deleted

## 2022-01-14 ENCOUNTER — Encounter: Payer: Self-pay | Admitting: *Deleted

## 2022-01-14 NOTE — Patient Instructions (Signed)
  Procedure: colonoscopy  Estimated body mass index is 30.38 kg/m as calculated from the following:   Height as of this encounter: 5' 4"  (1.626 m).   Weight as of this encounter: 177 lb (80.3 kg).   Have you had a colonoscopy before?  Yes  Do you have family history of colon cancer  no   Do you have a family history of polyps? Yes, grandfather (dad's side)  Previous colonoscopy with polyps removed? Yes  Do you have a history colorectal cancer?   No  Are you diabetic?  No  Do you have a prosthetic or mechanical heart valve? No  Do you have a pacemaker/defibrillator?   No  Have you had endocarditis/atrial fibrillation?  No  Do you use supplemental oxygen/CPAP?  No  Have you had joint replacement within the last 12 months?  No  Do you tend to be constipated or have to use laxatives?  Occasional stool softener   Do you have history of alcohol use? If yes, how much and how often.  Yes 0-3 small servings/week  Do you have history or are you using drugs? If yes, what do are you  using?  No  Have you ever had a stroke/heart attack?  no  Have you ever had a heart or other vascular stent placed,?No  Do you take weight loss medication? no  female patients,: have you had a hysterectomy? No                              are you post menopausal?  In process                              do you still have your menstrual cycle? No    Date of last menstrual period. 05/22/21  Do you take any blood-thinning medications such as: (Plavix, aspirin, Coumadin, Aggrenox, Brilinta, Xarelto, Eliquis, Pradaxa, Savaysa or Effient) No  If yes we need the name, milligram, dosage and who is prescribing doctor:  n/a             Current Outpatient Medications  Medication Sig Dispense Refill   acetaminophen (TYLENOL) 500 MG tablet Take 1,000 mg by mouth every 8 (eight) hours as needed for moderate pain.      doxycycline (VIBRAMYCIN) 50 MG capsule Take 50 mg by mouth daily.      MAXALT-MLT 10 MG  disintegrating tablet Take 1 tablet by mouth as needed for migraine.      mesalamine (APRISO) 0.375 g 24 hr capsule Take 750 mg by mouth daily.      pantoprazole (PROTONIX) 40 MG tablet Take 40 mg by mouth daily as needed (heartburn).      No current facility-administered medications for this visit.    No Known Allergies

## 2022-01-15 NOTE — Progress Notes (Signed)
Recent evaluation with cardiology in August for chest pain.  Recommend office visit - virtual is okay to determine ASA 2/3 status.

## 2022-01-18 ENCOUNTER — Encounter (INDEPENDENT_AMBULATORY_CARE_PROVIDER_SITE_OTHER): Payer: Self-pay | Admitting: *Deleted

## 2022-01-18 NOTE — Progress Notes (Unsigned)
Primary Care Physician:  Caryl Bis, MD  Primary Gastroenterologist: Cristopher Estimable. Rourk, MD  Patient Location: Home Reason for Visit: triage TCS  Persons present on the virtual encounter, with roles: Patient - Debra Taylor; Provider - Venetia Night, NP   Total time (minutes) spent on medical discussion: 15 minutes  Virtual Visit Encounter Note Visit is conducted virtually and was requested by patient.   I connected with Debra Taylor on 01/20/22 at  8:30 AM EST by video and verified that I am speaking with the correct person using two identifiers.   I discussed the limitations, risks, security and privacy concerns of performing an evaluation and management service by video and the availability of in person appointments. I also discussed with the patient that there may be a patient responsible charge related to this service. The patient expressed understanding and agreed to proceed.  Chief Complaint  Patient presents with   Colonoscopy    Colonoscopy screening     History of Present Illness: Debra Taylor is a 56 y.o. female with a history of depression, HLD, migraines, and ulcerative colitis (diagnosed at age 66).   Last colonoscopy June 2019 with mild colitis of the rectum and left colon. (Quiescent endoscopic appearance).  Last office visit in August 2020. Noted one prior UC flare in the last year with brief increase in Apriso. Denied abdominal pain and reported little heartburn about once per month. No weight loss. Advised to take Apriso 1.5g once daily for UC maintenance and reduction of colon cancer risk. Advised follow up in May 2021 to schedule colonoscopy.   Colonoscopy 01/23/20:  - 72m polyp in ascending colon (sessile serrated adenoma) - slightly diffuse pale rectal and colonic mucosa (quiescent procto-colitis) - rectal biopsy with mild crypt distortion (no active inflammation) - 2 year repeat recommended  Labs 04/21/21: normal AST, ALT, alk phos, and  bilirubin.   Recent evaluation for chest pain in August 2023.  Underwent exercise stress test with resting EKG showed no ST segment deviation, baseline normal sinus rhythm, Q waves noted in the anterior and lateral segments.  No evidence of ischemia.   Today: Chest pain - Was unsure if it was reflux or menopause. Drank a dr pepper yesterday yesterday and had issues with that. Needs to eat healthier. Chest pains have stopped. Also thinks it could have been stressed. Walking daily for about 30 minutes daily. Sometimes when she does go lon periods of time of eating her indigestion will go away. Unable to pinpoint anything else in particular. Does eat junk food. Spicy foods do not bother her. Does have some pill dysphagia but that has been baseline for her. Every once in a while.   Ulcerative colitis -  Disease has involved descending colon, sigmoid colon, and rectum. The patient is currently having a bowel movement about every 2 days without any difficulty. The patient currently denies significant abdominal pain or discomfort.  The patient has not fever.  The patient has not weight loss.  The patient has not extraintestinal symptoms .  Last colonoscopy was 01/23/2020. Has to be careful with timing of foods/diet. Has been a little more constipated over the last yea duties okay. Denies BRBPR, melena, lack of appetite, early satiety. Taking 750 mg daily of mesalamine, sometimes will take her recommended second dose. Due to her chronic issues with swallowing pills she has been taking them in applesauce without any issue and knows she needs to be a low bit better about taking  a full recommended 1.5 g daily.  Was groggy the day after her procedure last time.     Medications Current Meds  Medication Sig   acetaminophen (TYLENOL) 500 MG tablet Take 1,000 mg by mouth every 8 (eight) hours as needed for moderate pain.    cetirizine (ZYRTEC) 10 MG chewable tablet Chew 10 mg by mouth daily. Takes as needed    doxycycline (VIBRAMYCIN) 50 MG capsule Take 50 mg by mouth daily.    loratadine (CLARITIN) 10 MG tablet Take 10 mg by mouth daily. Takes as needed   MAXALT-MLT 10 MG disintegrating tablet Take 1 tablet by mouth as needed for migraine.    mesalamine (APRISO) 0.375 g 24 hr capsule Take 750 mg by mouth daily.    pantoprazole (PROTONIX) 40 MG tablet Take 40 mg by mouth daily as needed (heartburn).    propranolol (INDERAL) 40 MG tablet Take 40 mg by mouth daily. Take 1 at night     History Past Medical History:  Diagnosis Date   Chest pain    Depression    Hyperlipidemia    Migraine headache    Premenopausal patient    Conctraception   Ulcerative colitis    Onset at age 71    Past Surgical History:  Procedure Laterality Date   BIOPSY  09/02/2017   Procedure: BIOPSY;  Surgeon: Daneil Dolin, MD;  [facility];  Service: Endoscopy;;  ascending colon, transverse,   BIOPSY  01/23/2020   Procedure: BIOPSY;  Surgeon: Daneil Dolin, MD;  [facility];  Service: Endoscopy;;   COLONOSCOPY  08/2009   normal terminal ileum/inflammatory changes of the rectum and colon with panproctocolitis. No dysplasia on bx.    COLONOSCOPY  09/16/2011   RMR: Findings/Biopsies consistent with chronically mild active pan ulcerative colitis-endoscopically mild appearing. Status post segmental biopsy   COLONOSCOPY N/A 08/30/2013   RMR: Endoscopically active distal proctocolitis, comparable biopsies. No dysplasia.   COLONOSCOPY N/A 08/28/2015   Procedure: COLONOSCOPY;  Surgeon: Daneil Dolin, MD;  [facility];  Service: Endoscopy;  Laterality: N/A;  1215-moved to 1300   COLONOSCOPY N/A 09/02/2017   Procedure: COLONOSCOPY;  Surgeon: Daneil Dolin, MD;  [facility];  Service: Endoscopy;  Laterality: N/A;  12:00-rescheduled to 6/28 @ 7:30 per Almyra Free   COLONOSCOPY N/A 01/23/2020   Procedure: COLONOSCOPY;  Surgeon: Daneil Dolin, MD;  [facility];  Service:  Endoscopy;  Laterality: N/A;  9:15   DENTAL SURGERY     POLYPECTOMY  01/23/2020   Procedure: POLYPECTOMY;  Surgeon: Daneil Dolin, MD;  [facility];  Service: Endoscopy;;    Family History  Problem Relation Age of Onset   Heart attack Father    Lung disease Father    Uterine cancer Mother    Ovarian cancer Mother    Heart attack Brother    Colon cancer Neg Hx    Breast cancer Neg Hx    Lung cancer Neg Hx     Social History   Socioeconomic History   Marital status: Married    Spouse name: Not on file   Number of children: 0   Years of education: Not on file   Highest education level: Not on file  Occupational History   Occupation: [employer]    Employer: [employer]  Tobacco Use   Smoking status: Never   Smokeless tobacco: Never  Vaping Use   Vaping Use: Never  used  Substance and Sexual Activity   Alcohol use: Yes    Comment: Occasional   Drug use: No   Sexual activity: Not on file  Other Topics Concern   Not on file  Social History Narrative   Married, no children   Social Determinants of Health   Financial Resource Strain: Not on file  Food Insecurity: Not on file  Transportation Needs: Not on file  Physical Activity: Not on file  Stress: Not on file  Social Connections: Not on file      Review of Systems: Gen: Denies fever, chills, anorexia. Denies fatigue, weakness, weight loss.  CV: Denies chest pain, palpitations, syncope, peripheral edema, and claudication. Resp: Denies dyspnea at rest, cough, wheezing, coughing up blood, and pleurisy. GI: see HPI Derm: Denies rash, itching, dry skin Psych: Denies depression, anxiety, memory loss, confusion. No homicidal or suicidal ideation.  Heme: Denies bruising, bleeding, and enlarged lymph nodes.  Observations/Objective: No distress. Alert and oriented. Pleasant. Well nourished. Normal mood and affect. Unable to perform complete physical exam due to  video encounter.   Assessment:  Ulcerative Colitis, History of colon polyps: Doing well overall.  Recommended 1.5 g daily of mesalamine, currently has been taking 750 mg once daily, occasionally twice daily.  Denies any melena, BRBPR, weight loss, lack of appetite, or diarrhea.  Has a bowel movement about every 2 days without any difficulties.  Denies any abdominal pain.  Last colonoscopy in 2021 within ascending colon polyp and quiescent proctocolitis.  Currently due for surveillance.  We will proceed with scheduling surveillance TCS.  GERD: Intermittent symptoms, likely secondary to dietary choices.  Earlier this year she had some chest pain that she had evaluated and had a negative stress test.  Currently walks at least 30 minutes a day and does not have any chest pain and has had no more symptoms since August.  Symptoms are suspected to be secondary to reflux or possibly stress.  Takes pantoprazole 40 mg as needed for symptoms she would like prescription for this.  Has occasional pill dysphagia but able to take medications with applesauce without any issue.  Denies any increased frequency of symptoms and not needing any further intervention at this time.   Plan:  Proceed with colonoscopy with propofol by Dr. Gala Romney in near future: the risks, benefits, and alternatives have been discussed with the patient in detail. The patient states understanding and desires to proceed. ASA 2 Urine pregnancy pre-op Apriso 1,500 mg daily, refilled today. Pantoprazole 40 mg daily as needed, could use famotidine instead. GERD diet Follow-up in 1 year, or sooner if needed.    Follow Up Instructions:  I discussed the assessment and treatment plan with the patient. The patient was provided an opportunity to ask questions and all were answered. The patient agreed with the plan and demonstrated an understanding of the instructions.   The patient was advised to call back or seek an in-person evaluation if the  symptoms worsen or if the condition fails to improve as anticipated.    Venetia Night, MSN, APRN, FNP-BC, AGACNP-BC [facility]

## 2022-01-18 NOTE — Progress Notes (Signed)
Please schedule per [facility]

## 2022-01-20 ENCOUNTER — Encounter: Payer: Self-pay | Admitting: Gastroenterology

## 2022-01-20 ENCOUNTER — Telehealth: Payer: Self-pay | Admitting: *Deleted

## 2022-01-20 ENCOUNTER — Telehealth (INDEPENDENT_AMBULATORY_CARE_PROVIDER_SITE_OTHER): Payer: BC Managed Care – PPO | Admitting: Gastroenterology

## 2022-01-20 VITALS — Ht 64.5 in | Wt 176.0 lb

## 2022-01-20 DIAGNOSIS — K5190 Ulcerative colitis, unspecified, without complications: Secondary | ICD-10-CM

## 2022-01-20 DIAGNOSIS — Z86010 Personal history of colonic polyps: Secondary | ICD-10-CM

## 2022-01-20 DIAGNOSIS — K219 Gastro-esophageal reflux disease without esophagitis: Secondary | ICD-10-CM

## 2022-01-20 MED ORDER — PANTOPRAZOLE SODIUM 40 MG PO TBEC: 40.0000 mg | DELAYED_RELEASE_TABLET | Freq: Every day | ORAL | 5 refills | Status: DC | PRN

## 2022-01-20 MED ORDER — PEG 3350-KCL-NA BICARB-NACL 420 G PO SOLR: 4000.0000 mL | Freq: Once | ORAL | 0 refills | Status: AC

## 2022-01-20 MED ORDER — MESALAMINE ER 0.375 G PO CP24: 1500.0000 mg | ORAL_CAPSULE | Freq: Every day | ORAL | 6 refills | Status: DC

## 2022-01-20 NOTE — Patient Instructions (Addendum)
We are scheduling you for a colonoscopy in the near future with Dr. Gala Romney.  You will receive separate instructions in the mail.  Given that you are not fully postmenopausal yet, you will need to perform a urine pregnancy test prior to your procedure (protocol).  You may take pantoprazole 40 mg as needed for heartburn/indigestion. Follow a GERD diet:  Avoid fried, fatty, greasy, spicy, citrus foods. Avoid caffeine and carbonated beverages. Avoid chocolate. Try eating 4-6 small meals a day rather than 3 large meals. Do not eat within 3 hours of laying down. Prop head of bed up on wood or bricks to create a 6 inch incline.  Continue taking your mesalamine.  The recommended daily dose is 1.5 g (4 capsules).  Given they have to take with applesauce, you could take the entire daily dose in the mornings rather than 750 in the morning and 750 in the evening.  If your constipation worsens or you have more than 2 days without a bowel movement on a regular basis or you begin experiencing frequent diarrhea or blood in your stools, please follow-up in the office.  We will have you follow-up in 1 year, or sooner if needed.  It was a pleasure to see you today. I want to create trusting relationships with patients. If you receive a survey regarding your visit,  I greatly appreciate you taking time to fill this out on paper or through your MyChart. I value your feedback.  Venetia Night, MSN, FNP-BC, AGACNP-BC [facility]

## 2022-01-20 NOTE — Telephone Encounter (Signed)
Called pt. Scheduled for 12/11 at 11:15am. Aware will send instructions. Rx for prep to pharmacy. Aware needs preg test prior.

## 2022-01-20 NOTE — Telephone Encounter (Signed)
-----   Message from Sherron Monday, NP sent at 01/20/2022  9:12 AM EST ----- Regarding: procedure and follow up Patient seen for virtual visit this morning.  Debra Taylor/Tammy -TCS with Dr. Gala Romney: ASA 2, Urine pregnancy pre-op.  Dx: Ulcerative colitis and history of colon polyps  Mandy -please NIC for follow-up in 1 year.  Venetia Night, MSN, APRN, FNP-BC, AGACNP-BC [facility]

## 2022-01-21 ENCOUNTER — Encounter: Payer: Self-pay | Admitting: *Deleted

## 2022-01-21 ENCOUNTER — Telehealth: Payer: Self-pay | Admitting: *Deleted

## 2022-01-21 MED ORDER — NA SULFATE-K SULFATE-MG SULF 17.5-3.13-1.6 GM/177ML PO SOLN: ORAL | 0 refills | Status: DC

## 2022-01-21 NOTE — Telephone Encounter (Signed)
Pt had left a voicemail stating that she had went to pick up her prep yesterday and it was the "gallon jug". She wants the same prep as before sent to the pharmacy. Looking back she had gotten the suprep . New prescription sent to pharmacy per pt's request and new instructions mailed to pt.

## 2022-02-12 ENCOUNTER — Other Ambulatory Visit (HOSPITAL_COMMUNITY)
Admission: RE | Admit: 2022-02-12 | Discharge: 2022-02-12 | Disposition: A | Discharge status: Home / Self Care | Payer: BC Managed Care – PPO | Source: Ambulatory Visit | Attending: Internal Medicine | Admitting: Internal Medicine

## 2022-02-12 DIAGNOSIS — K5190 Ulcerative colitis, unspecified, without complications: Secondary | ICD-10-CM

## 2022-02-12 DIAGNOSIS — K6289 Other specified diseases of anus and rectum: Secondary | ICD-10-CM | POA: Diagnosis not present

## 2022-02-12 DIAGNOSIS — K635 Polyp of colon: Secondary | ICD-10-CM | POA: Diagnosis not present

## 2022-02-12 DIAGNOSIS — Z86010 Personal history of colonic polyps: Secondary | ICD-10-CM | POA: Insufficient documentation

## 2022-02-12 DIAGNOSIS — K648 Other hemorrhoids: Secondary | ICD-10-CM | POA: Diagnosis not present

## 2022-02-12 DIAGNOSIS — Z1211 Encounter for screening for malignant neoplasm of colon: Secondary | ICD-10-CM | POA: Diagnosis not present

## 2022-02-12 DIAGNOSIS — K5100 Ulcerative (chronic) pancolitis without complications: Secondary | ICD-10-CM | POA: Diagnosis not present

## 2022-02-12 LAB — PREGNANCY, URINE: Preg Test, Ur: NEGATIVE

## 2022-02-15 ENCOUNTER — Ambulatory Visit (HOSPITAL_COMMUNITY)
Admission: RE | Admit: 2022-02-15 | Discharge: 2022-02-15 | Disposition: A | Discharge status: Home / Self Care | Payer: BC Managed Care – PPO | Attending: Internal Medicine | Admitting: Internal Medicine

## 2022-02-15 ENCOUNTER — Encounter (HOSPITAL_COMMUNITY)
Admission: RE | Disposition: A | Discharge status: Home / Self Care | Payer: Self-pay | Source: Home / Self Care | Attending: Internal Medicine

## 2022-02-15 ENCOUNTER — Ambulatory Visit (HOSPITAL_COMMUNITY): Payer: BC Managed Care – PPO | Admitting: Certified Registered Nurse Anesthetist

## 2022-02-15 ENCOUNTER — Encounter (HOSPITAL_COMMUNITY): Payer: Self-pay | Admitting: Internal Medicine

## 2022-02-15 ENCOUNTER — Other Ambulatory Visit: Payer: Self-pay

## 2022-02-15 DIAGNOSIS — Z1211 Encounter for screening for malignant neoplasm of colon: Secondary | ICD-10-CM | POA: Diagnosis not present

## 2022-02-15 DIAGNOSIS — K2100 Gastro-esophageal reflux disease with esophagitis, without bleeding: Secondary | ICD-10-CM

## 2022-02-15 DIAGNOSIS — K648 Other hemorrhoids: Secondary | ICD-10-CM | POA: Insufficient documentation

## 2022-02-15 DIAGNOSIS — K5190 Ulcerative colitis, unspecified, without complications: Secondary | ICD-10-CM | POA: Diagnosis not present

## 2022-02-15 DIAGNOSIS — K5100 Ulcerative (chronic) pancolitis without complications: Secondary | ICD-10-CM | POA: Diagnosis not present

## 2022-02-15 DIAGNOSIS — K6289 Other specified diseases of anus and rectum: Secondary | ICD-10-CM | POA: Diagnosis not present

## 2022-02-15 DIAGNOSIS — Z86010 Personal history of colonic polyps: Secondary | ICD-10-CM | POA: Diagnosis not present

## 2022-02-15 DIAGNOSIS — K635 Polyp of colon: Secondary | ICD-10-CM | POA: Insufficient documentation

## 2022-02-15 HISTORY — PX: BIOPSY: SHX5522

## 2022-02-15 HISTORY — PX: COLONOSCOPY WITH PROPOFOL: SHX5780

## 2022-02-15 SURGERY — COLONOSCOPY WITH PROPOFOL
Anesthesia: General

## 2022-02-15 MED ORDER — PROPOFOL 10 MG/ML IV BOLUS
INTRAVENOUS | Status: DC | PRN
  Administered 2022-02-15: 50 mg via INTRAVENOUS
  Administered 2022-02-15: 60 mg via INTRAVENOUS

## 2022-02-15 MED ORDER — PHENYLEPHRINE HCL (PRESSORS) 10 MG/ML IV SOLN
INTRAVENOUS | Status: DC | PRN
  Administered 2022-02-15 (×2): 80 ug via INTRAVENOUS

## 2022-02-15 MED ORDER — PROPOFOL 500 MG/50ML IV EMUL
INTRAVENOUS | Status: DC | PRN
  Administered 2022-02-15: 150 ug/kg/min via INTRAVENOUS

## 2022-02-15 MED ORDER — LACTATED RINGERS IV SOLN: INTRAVENOUS | Status: DC

## 2022-02-15 MED ORDER — STERILE WATER FOR IRRIGATION IR SOLN
Status: DC | PRN
  Administered 2022-02-15: 50 mL

## 2022-02-15 MED ORDER — PROPOFOL 500 MG/50ML IV EMUL
INTRAVENOUS | Status: AC
  Filled 2022-02-15: qty 50

## 2022-02-15 NOTE — Transfer of Care (Signed)
Immediate Anesthesia Transfer of Care Note  Patient: Debra Taylor  Procedure(s) Performed: COLONOSCOPY WITH PROPOFOL BIOPSY  Patient Location: PACU  Anesthesia Type:General  Level of Consciousness: awake, alert , and oriented  Airway & Oxygen Therapy: Patient Spontanous Breathing  Post-op Assessment: Report given to RN, Post -op Vital signs reviewed and stable, Patient moving all extremities X 4, and Patient able to stick tongue midline  Post vital signs: Reviewed  Last Vitals:  Vitals Value Taken Time  BP 82/41   Temp 97.6   Pulse 65   Resp 14   SpO2 96     Last Pain:  Vitals:   02/15/22 1052  TempSrc:   PainSc: 0-No pain         Complications: No notable events documented.

## 2022-02-15 NOTE — Op Note (Signed)
[facility] Patient Name: Debra Taylor Procedure Date: 02/15/2022 10:23 AM MRN: [id number] Date of Birth: 03/16/1965 Attending MD: Norvel Richards , MD, [id number] CSN: [id number] Age: 56 Admit Type: Outpatient Procedure:                Colonoscopy Indications:              High risk colon cancer surveillance: Ulcerative                            pancolitis of 8 (or more) years duration Providers:                Norvel Richards, MD, Charlsie Quest. Theda Sers RN, RN,                            Ladoris Gene Technician, Technician Referring MD:              Medicines:                Propofol per Anesthesia Complications:            No immediate complications. Estimated Blood Loss:     Estimated blood loss: Minimal Procedure:                Pre-Anesthesia Assessment:                           - Prior to the procedure, a History and Physical                            was performed, and patient medications and                            allergies were reviewed. The patient's tolerance of                            previous anesthesia was also reviewed. The risks                            and benefits of the procedure and the sedation                            options and risks were discussed with the patient.                            All questions were answered, and informed consent                            was obtained. Prior Anticoagulants: The patient has                            taken no anticoagulant or antiplatelet agents. ASA                            Grade Assessment: II - A patient with mild systemic  disease. After reviewing the risks and benefits,                            the patient was deemed in satisfactory condition to                            undergo the procedure.                           After obtaining informed consent, the colonoscope                            was passed under direct vision. Throughout the                             procedure, the patient's blood pressure, pulse, and                            oxygen saturations were monitored continuously. The                            [phone]) scope was introduced through the                            anus and advanced to the the cecum, identified by                            appendiceal orifice and ileocecal valve. The                            colonoscopy was performed without difficulty. The                            patient tolerated the procedure well. The quality                            of the bowel preparation was adequate. The                            ileocecal valve, appendiceal orifice, and rectum                            were photographed. The entire colon was well                            visualized. Scope In: 10:53:33 AM Scope Out: 11:10:24 AM Scope Withdrawal Time: 0 hours 10 minutes 59 seconds  Total Procedure Duration: 0 hours 16 minutes 51 seconds  Findings:      The perianal and digital rectal examinations were normal.      The colonic mucosa all the way to the cecum was inspected closely.       Slightly pale mucosa throughout. No focal lesions. No granularity or       friability. Certainly no ulcers. Vascular pattern preserved throughout       the  colon. No nodularity.      The retroflexed view of the distal rectum and anal verge revealed       internal hemorrhoids minimal chronic inflammatory changes of pale mucosa       tiny areas of neovascular change.; Otherwise, normal-appearing rectum.       Frequent segmental biopsies were taken throughout the ascending,       transverse, descending, sigmoid and rectal segments. Impression:               - Subtle colonic mucosal changes consistent with                            inactive ulcerative colitis.                           ?"Subtle rectal mucosal changes.. Status post                            segmental biopsy. Moderate Sedation:      Moderate  (conscious) sedation was personally administered by an       anesthesia professional. The following parameters were monitored: oxygen       saturation, heart rate, blood pressure, respiratory rate, EKG, adequacy       of pulmonary ventilation, and response to care. Total physician       intraservice time was 15 minutes. Recommendation:           - Patient has a contact number available for                            emergencies. The signs and symptoms of potential                            delayed complications were discussed with the                            patient. Return to normal activities tomorrow.                            Written discharge instructions were provided to the                            patient.                           - Advance diet as tolerated.                           - Continue present medications.                           - Repeat colonoscopy date to be determined after                            pending pathology results are reviewed for                            surveillance.                           -  Return to GI office in 1 year. Procedure Code(s):        --- Professional ---                           [phone], Colonoscopy, flexible; diagnostic, including                            collection of specimen(s) by brushing or washing,                            when performed (separate procedure) Diagnosis Code(s):        --- Professional ---                           K51.00, Ulcerative (chronic) pancolitis without                            complications CPT copyright 2022 [employer]. All rights reserved. The codes documented in this report are preliminary and upon coder review may  be revised to meet current compliance requirements. Cristopher Estimable. Leafy Motsinger, MD Norvel Richards, MD 02/15/2022 11:26:54 AM This report has been signed electronically. Number of Addenda: 0

## 2022-02-15 NOTE — Anesthesia Procedure Notes (Signed)
Date/Time: 02/15/2022 10:48 AM  Performed by: Maude Leriche, CRNAPre-anesthesia Checklist: Patient identified, Emergency Drugs available, Suction available, Patient being monitored and Timeout performed Patient Re-evaluated:Patient Re-evaluated prior to induction Oxygen Delivery Method: Nasal cannula Placement Confirmation: positive ETCO2 Dental Injury: Teeth and Oropharynx as per pre-operative assessment

## 2022-02-15 NOTE — Anesthesia Postprocedure Evaluation (Signed)
Anesthesia Post Note  Patient: Debra Taylor  Procedure(s) Performed: COLONOSCOPY WITH PROPOFOL BIOPSY  Patient location during evaluation: Phase II Anesthesia Type: General Level of consciousness: awake Pain management: pain level controlled Vital Signs Assessment: post-procedure vital signs reviewed and stable Respiratory status: spontaneous breathing and respiratory function stable Cardiovascular status: blood pressure returned to baseline and stable Postop Assessment: no headache and no apparent nausea or vomiting Anesthetic complications: no Comments: Late entry   No notable events documented.   Last Vitals:  Vitals:   02/15/22 1004 02/15/22 1114  BP: (!) 147/69 (!) 116/53  Pulse: 64 66  Resp: 17 13  Temp: 36.6 C 36.4 C  SpO2: 100% 96%    Last Pain:  Vitals:   02/15/22 1114  TempSrc: Oral  PainSc: 0-No pain                 Louann Sjogren

## 2022-02-15 NOTE — Discharge Instructions (Addendum)
  Colonoscopy Discharge Instructions  Read the instructions outlined below and refer to this sheet in the next few weeks. These discharge instructions provide you with general information on caring for yourself after you leave the hospital. Your doctor may also give you specific instructions. While your treatment has been planned according to the most current medical practices available, unavoidable complications occasionally occur. If you have any problems or questions after discharge, call Dr. Gala Romney at [phone]. ACTIVITY You may resume your regular activity, but move at a slower pace for the next 24 hours.  Take frequent rest periods for the next 24 hours.  Walking will help get rid of the air and reduce the bloated feeling in your belly (abdomen).  No driving for 24 hours (because of the medicine (anesthesia) used during the test).   Do not sign any important legal documents or operate any machinery for 24 hours (because of the anesthesia used during the test).  NUTRITION Drink plenty of fluids.  You may resume your normal diet as instructed by your doctor.  Begin with a light meal and progress to your normal diet. Heavy or fried foods are harder to digest and may make you feel sick to your stomach (nauseated).  Avoid alcoholic beverages for 24 hours or as instructed.  MEDICATIONS You may resume your normal medications unless your doctor tells you otherwise.  WHAT YOU CAN EXPECT TODAY Some feelings of bloating in the abdomen.  Passage of more gas than usual.  Spotting of blood in your stool or on the toilet paper.  IF YOU HAD POLYPS REMOVED DURING THE COLONOSCOPY: No aspirin products for 7 days or as instructed.  No alcohol for 7 days or as instructed.  Eat a soft diet for the next 24 hours.  FINDING OUT THE RESULTS OF YOUR TEST Not all test results are available during your visit. If your test results are not back during the visit, make an appointment with your caregiver to find out the  results. Do not assume everything is normal if you have not heard from your caregiver or the medical facility. It is important for you to follow up on all of your test results.  SEEK IMMEDIATE MEDICAL ATTENTION IF: You have more than a spotting of blood in your stool.  Your belly is swollen (abdominal distention).  You are nauseated or vomiting.  You have a temperature over 101.  You have abdominal pain or discomfort that is severe or gets worse throughout the day.      your colon really look good from 1 end to the other.  Biopsies throughout your entire colon taken.  No polyps.    Further recommendations to follow pending review of pathology report  At patient request, I called Debra Taylor at [phone] findings

## 2022-02-15 NOTE — Anesthesia Preprocedure Evaluation (Signed)
Anesthesia Evaluation  Patient identified by MRN, date of birth, ID band Patient awake    Reviewed: Allergy & Precautions, H&P , NPO status , Patient's Chart, lab work & pertinent test results, reviewed documented beta blocker date and time   Airway Mallampati: II  TM Distance: >3 FB Neck ROM: full    Dental no notable dental hx.    Pulmonary neg pulmonary ROS   Pulmonary exam normal breath sounds clear to auscultation       Cardiovascular Exercise Tolerance: Good negative cardio ROS  Rhythm:regular Rate:Normal     Neuro/Psych  Headaches PSYCHIATRIC DISORDERS  Depression       GI/Hepatic negative GI ROS, Neg liver ROS,,,  Endo/Other  negative endocrine ROS    Renal/GU negative Renal ROS  negative genitourinary   Musculoskeletal   Abdominal   Peds  Hematology negative hematology ROS (+)   Anesthesia Other Findings   Reproductive/Obstetrics negative OB ROS                             Anesthesia Physical Anesthesia Plan  ASA: 2  Anesthesia Plan: General   Post-op Pain Management:    Induction:   PONV Risk Score and Plan: Propofol infusion  Airway Management Planned:   Additional Equipment:   Intra-op Plan:   Post-operative Plan:   Informed Consent: I have reviewed the patients History and Physical, chart, labs and discussed the procedure including the risks, benefits and alternatives for the proposed anesthesia with the patient or authorized representative who has indicated his/her understanding and acceptance.     Dental Advisory Given  Plan Discussed with: CRNA  Anesthesia Plan Comments:        Anesthesia Quick Evaluation

## 2022-02-15 NOTE — H&P (Signed)
@LOGO @   Primary Care Physician:  Caryl Bis, MD Primary Gastroenterologist:  Dr. Gala Romney  Pre-Procedure History & Physical: HPI:  Debra Taylor is a 56 y.o. female here for  for surveillance colonoscopy.  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  Adenomatous polyp removed 2 years ago.  Anoscopy.  Past Medical History:  Diagnosis Date   Chest pain    Depression    Hyperlipidemia    Migraine headache    Premenopausal patient    Conctraception   Ulcerative colitis    Onset at age 68    Past Surgical History:  Procedure Laterality Date   BIOPSY  09/02/2017   Procedure: BIOPSY;  Surgeon: Daneil Dolin, MD;  Location: [facility];  Service: Endoscopy;;  ascending colon, transverse,   BIOPSY  01/23/2020   Procedure: BIOPSY;  Surgeon: Daneil Dolin, MD;  Location: [facility];  Service: Endoscopy;;   COLONOSCOPY  08/2009   normal terminal ileum/inflammatory changes of the rectum and colon with panproctocolitis. No dysplasia on bx.    COLONOSCOPY  09/16/2011   RMR: Findings/Biopsies consistent with chronically mild active pan ulcerative colitis-endoscopically mild appearing. Status post segmental biopsy   COLONOSCOPY N/A 08/30/2013   RMR: Endoscopically active distal proctocolitis, comparable biopsies. No dysplasia.   COLONOSCOPY N/A 08/28/2015   Procedure: COLONOSCOPY;  Surgeon: Daneil Dolin, MD;  Location: [facility];  Service: Endoscopy;  Laterality: N/A;  1215-moved to 1300   COLONOSCOPY N/A 09/02/2017   Procedure: COLONOSCOPY;  Surgeon: Daneil Dolin, MD;  Location: [facility];  Service: Endoscopy;  Laterality: N/A;  12:00-rescheduled to 6/28 @ 7:30 per Almyra Free   COLONOSCOPY N/A 01/23/2020   Procedure: COLONOSCOPY;  Surgeon: Daneil Dolin, MD;  Location: [facility];  Service: Endoscopy;  Laterality: N/A;  9:15   DENTAL SURGERY     POLYPECTOMY  01/23/2020   Procedure: POLYPECTOMY;  Surgeon: Daneil Dolin, MD;  Location: [facility];  Service: Endoscopy;;    Prior to Admission medications   Medication Sig Start Date End Date Taking? Authorizing Provider  acetaminophen (TYLENOL) 500 MG tablet Take 1,000 mg by mouth every 8 (eight) hours as needed for moderate pain.    Yes [provider]  cetirizine (ZYRTEC) 10 MG tablet Take 10 mg by mouth daily as needed for allergies.   Yes [provider]  docusate sodium (COLACE) 100 MG capsule Take 100 mg by mouth daily as needed for mild constipation.   Yes [provider]  doxycycline (VIBRAMYCIN) 50 MG capsule Take 50 mg by mouth daily.  07/24/15  Yes [provider]  loratadine (CLARITIN) 10 MG tablet Take 10 mg by mouth daily as needed for allergies.   Yes [provider]  mesalamine (APRISO) 0.375 g 24 hr capsule Take 4 capsules (1.5 g total) by mouth daily. Patient taking differently: Take 750 mg by mouth in the morning and at bedtime. 01/20/22  Yes Mahon, Courtney L, NP  mometasone (NASONEX) 50 MCG/ACT nasal spray Place 2 sprays into the nose daily.   Yes [provider]  pantoprazole (PROTONIX) 40 MG tablet Take 1 tablet (40 mg total) by mouth daily as needed (heartburn). 01/20/22  Yes Mahon, Lenise Arena, NP  propranolol (INDERAL) 40 MG tablet Take 40 mg by mouth at bedtime.   Yes [provider]  Propylene Glycol (SYSTANE BALANCE) 0.6 % SOLN Place 1 drop into both eyes as needed (dry eyes).   Yes [provider]  rizatriptan (MAXALT-MLT) 10 MG disintegrating tablet Take 10 mg by mouth as needed for migraine. 09/18/18  Yes [provider]  Na Sulfate-K Sulfate-Mg Sulf 17.5-3.13-1.6 GM/177ML SOLN As directed 01/21/22   Lamyia Cdebaca, Cristopher Estimable, MD    Allergies as of 01/20/2022   (No Known Allergies)    Family History  Problem Relation Age of Onset   Heart attack Father    Lung disease Father    Uterine cancer Mother    Ovarian cancer Mother    Heart attack Brother    Colon cancer Neg Hx     Breast cancer Neg Hx    Lung cancer Neg Hx     Social History   Socioeconomic History   Marital status: Married    Spouse name: Not on file   Number of children: 0   Years of education: Not on file   Highest education level: Not on file  Occupational History   Occupation: [employer]    Employer: [employer]  Tobacco Use   Smoking status: Never   Smokeless tobacco: Never  Vaping Use   Vaping Use: Never used  Substance and Sexual Activity   Alcohol use: Yes    Comment: Occasional   Drug use: No   Sexual activity: Not on file  Other Topics Concern   Not on file  Social History Narrative   Married, no children   Social Determinants of Health   Financial Resource Strain: Not on file  Food Insecurity: Not on file  Transportation Needs: Not on file  Physical Activity: Not on file  Stress: Not on file  Social Connections: Not on file  Intimate Partner Violence: Not on file    Review of Systems: See HPI, otherwise negative ROS  Physical Exam: BP (!) 147/69   Pulse 64   Temp 97.8 F (36.6 C) (Oral)   Resp 17   Ht 5' 4.5" (1.638 m)   Wt 78 kg   LMP 05/22/2021 (Approximate)   SpO2 100%   BMI 29.07 kg/m  General:   Alert,  Well-developed, well-nourished, pleasant and cooperative in NAD Mouth:  No deformity or lesions. Neck:  Supple; no masses or thyromegaly. No significant cervical adenopathy. Lungs:  Clear throughout to auscultation.   No wheezes, crackles, or rhonchi. No acute distress. Heart:  Regular rate and rhythm; no murmurs, clicks, rubs,  or gallops. Abdomen: Non-distended, normal bowel sounds.  Soft and nontender without appreciable mass or hepatosplenomegaly.  Pulses:  Normal pulses noted. Extremities:  Without clubbing or edema.  Impression/Plan:    56 year old lady with longstanding left-sided proctocolitis and history of colonic adenoma.  Here for 2-year surveillance examination.  UC well-controlled on  low-dose mesalamine.   I have offered her surveillance colonoscopy today. The risks, benefits, limitations, alternatives and imponderables have been reviewed with the patient. Questions have been answered. All parties are agreeable.       Notice: This dictation was prepared with Dragon dictation along with smaller phrase technology. Any transcriptional errors that result from this process are unintentional and may not be corrected upon review.

## 2022-02-16 LAB — SURGICAL PATHOLOGY

## 2022-02-19 ENCOUNTER — Encounter: Payer: Self-pay | Admitting: Internal Medicine

## 2022-02-22 ENCOUNTER — Encounter (HOSPITAL_COMMUNITY): Payer: Self-pay | Admitting: Internal Medicine

## 2022-03-19 DIAGNOSIS — Z0001 Encounter for general adult medical examination with abnormal findings: Secondary | ICD-10-CM | POA: Diagnosis not present

## 2022-03-19 DIAGNOSIS — E7849 Other hyperlipidemia: Secondary | ICD-10-CM | POA: Diagnosis not present

## 2022-03-25 DIAGNOSIS — G43909 Migraine, unspecified, not intractable, without status migrainosus: Secondary | ICD-10-CM | POA: Diagnosis not present

## 2022-03-25 DIAGNOSIS — Z1331 Encounter for screening for depression: Secondary | ICD-10-CM | POA: Diagnosis not present

## 2022-03-25 DIAGNOSIS — L719 Rosacea, unspecified: Secondary | ICD-10-CM | POA: Diagnosis not present

## 2022-03-25 DIAGNOSIS — Z0001 Encounter for general adult medical examination with abnormal findings: Secondary | ICD-10-CM | POA: Diagnosis not present

## 2022-03-25 DIAGNOSIS — Z1389 Encounter for screening for other disorder: Secondary | ICD-10-CM | POA: Diagnosis not present

## 2022-03-25 DIAGNOSIS — K5100 Ulcerative (chronic) pancolitis without complications: Secondary | ICD-10-CM | POA: Diagnosis not present

## 2022-03-25 DIAGNOSIS — E7849 Other hyperlipidemia: Secondary | ICD-10-CM | POA: Diagnosis not present

## 2022-04-14 DIAGNOSIS — L719 Rosacea, unspecified: Secondary | ICD-10-CM | POA: Diagnosis not present

## 2022-05-03 DIAGNOSIS — Z1231 Encounter for screening mammogram for malignant neoplasm of breast: Secondary | ICD-10-CM | POA: Diagnosis not present

## 2022-08-23 DIAGNOSIS — K5190 Ulcerative colitis, unspecified, without complications: Secondary | ICD-10-CM | POA: Diagnosis not present

## 2022-08-23 DIAGNOSIS — Z6826 Body mass index (BMI) 26.0-26.9, adult: Secondary | ICD-10-CM | POA: Diagnosis not present

## 2022-08-23 DIAGNOSIS — K5900 Constipation, unspecified: Secondary | ICD-10-CM | POA: Diagnosis not present

## 2022-08-24 ENCOUNTER — Emergency Department (HOSPITAL_COMMUNITY)
Admission: EM | Admit: 2022-08-24 | Discharge: 2022-08-24 | Disposition: A | Discharge status: Home / Self Care | Payer: BC Managed Care – PPO | Attending: Emergency Medicine | Admitting: Emergency Medicine

## 2022-08-24 ENCOUNTER — Other Ambulatory Visit: Payer: Self-pay

## 2022-08-24 ENCOUNTER — Emergency Department (HOSPITAL_COMMUNITY): Payer: BC Managed Care – PPO

## 2022-08-24 ENCOUNTER — Encounter (HOSPITAL_COMMUNITY): Payer: Self-pay | Admitting: Emergency Medicine

## 2022-08-24 DIAGNOSIS — R0789 Other chest pain: Secondary | ICD-10-CM

## 2022-08-24 DIAGNOSIS — R079 Chest pain, unspecified: Secondary | ICD-10-CM | POA: Diagnosis not present

## 2022-08-24 LAB — TROPONIN I (HIGH SENSITIVITY)
Troponin I (High Sensitivity): 2 ng/L (ref <18)
Troponin I (High Sensitivity): 2 ng/L (ref <18)

## 2022-08-24 LAB — BASIC METABOLIC PANEL
Anion gap: 9 (ref 5–15)
BUN: 14 mg/dL (ref 6–20)
CO2: 28 mmol/L (ref 22–32)
Calcium: 9 mg/dL (ref 8.9–10.3)
Chloride: 101 mmol/L (ref 98–111)
Creatinine, Ser: 0.75 mg/dL (ref 0.44–1.00)
GFR, Estimated: >60 mL/min (ref >60)
Glucose, Bld: 99 mg/dL (ref 70–99)
Potassium: 3.3 mmol/L — ABNORMAL LOW (ref 3.5–5.1)
Sodium: 138 mmol/L (ref 135–145)

## 2022-08-24 LAB — CBC
HCT: 41.3 % (ref 36.0–46.0)
Hemoglobin: 13.2 g/dL (ref 12.0–15.0)
MCH: 29 pg (ref 26.0–34.0)
MCHC: 32 g/dL (ref 30.0–36.0)
MCV: 90.8 fL (ref 80.0–100.0)
Platelets: 253 10*3/uL (ref 150–400)
RBC: 4.55 MIL/uL (ref 3.87–5.11)
RDW: 16.3 % — ABNORMAL HIGH (ref 11.5–15.5)
WBC: 6.5 10*3/uL (ref 4.0–10.5)
nRBC: 0 % (ref 0.0–0.2)

## 2022-08-24 LAB — LIPASE, BLOOD: Lipase: 44 U/L (ref 11–51)

## 2022-08-24 NOTE — ED Notes (Signed)
Pt care taken, no complaints at this time, denies any pain at this time due to taking a pepcid before arrival.

## 2022-08-24 NOTE — ED Triage Notes (Signed)
Pt c/o central chest pain. Hx ulcerative colitis with a flare x 3 weeks with indigestion transitioning to chest pressure 2 days ago, radiating down arms to cause numbness in hands and fingers. Pt has felt nauseated but no vomiting or diarrhea. Pt denies SOB, lightheadedness, dizziness.

## 2022-08-24 NOTE — ED Provider Notes (Signed)
[facility] Provider Note   CSN: [id number] Arrival date & time: 08/24/22  1755     History  Chief Complaint  Patient presents with   Chest Pain    Debra Taylor is a 57 y.o. female.  Patient is a 57 year old female with past medical history of ulcerative colitis, hyperlipidemia.  Patient presenting today with complaints of chest discomfort.  She reports a flareup of ulcerative colitis 3 weeks ago and during this time has been taking laxatives and watching her diet.  She has reported intermittent tightness in her chest during these 3 weeks, but has been worse over the past 3 days.  Symptoms seem to be worse when she eats with no associated shortness of breath, nausea, or diaphoresis.  She does describe some radiation into her left arm.  She denies any exertional symptoms.  Patient has no prior cardiac history, but tells me she has had 2 stress tests in the past, both of which were unremarkable.  She has been trying antacids at home with some relief.  Patient has an appointment with her gastroenterologist tomorrow.  The history is provided by the patient.       Home Medications Prior to Admission medications   Medication Sig Start Date End Date Taking? Authorizing Provider  acetaminophen (TYLENOL) 500 MG tablet Take 1,000 mg by mouth every 8 (eight) hours as needed for moderate pain.     [provider]  cetirizine (ZYRTEC) 10 MG tablet Take 10 mg by mouth daily as needed for allergies.    [provider]  docusate sodium (COLACE) 100 MG capsule Take 100 mg by mouth daily as needed for mild constipation.    [provider]  doxycycline (VIBRAMYCIN) 50 MG capsule Take 50 mg by mouth daily.  07/24/15   [provider]  loratadine (CLARITIN) 10 MG tablet Take 10 mg by mouth daily as needed for allergies.    [provider]  mesalamine (APRISO) 0.375 g 24 hr capsule Take 4 capsules (1.5 g total) by  mouth daily. Patient taking differently: Take 750 mg by mouth in the morning and at bedtime. 01/20/22   Mahon, Frederik Schmidt, NP  mometasone (NASONEX) 50 MCG/ACT nasal spray Place 2 sprays into the nose daily.    [provider]  Na Sulfate-K Sulfate-Mg Sulf 17.5-3.13-1.6 GM/177ML SOLN As directed 01/21/22   Rourk, Gerrit Friends, MD  pantoprazole (PROTONIX) 40 MG tablet Take 1 tablet (40 mg total) by mouth daily as needed (heartburn). 01/20/22   Aida Raider, NP  propranolol (INDERAL) 40 MG tablet Take 40 mg by mouth at bedtime.    [provider]  Propylene Glycol (SYSTANE BALANCE) 0.6 % SOLN Place 1 drop into both eyes as needed (dry eyes).    [provider]  rizatriptan (MAXALT-MLT) 10 MG disintegrating tablet Take 10 mg by mouth as needed for migraine. 09/18/18   [provider]      Allergies    Patient has no known allergies.    Review of Systems   Review of Systems  All other systems reviewed and are negative.   Physical Exam Updated Vital Signs BP 131/63   Pulse 62   Temp 98.4 F (36.9 C)   Resp (!) 28   Ht 5' 4.5" (1.638 m)   Wt 72.6 kg   LMP 05/22/2021 (Approximate)   SpO2 99%   BMI 27.04 kg/m  Physical Exam Vitals and nursing note reviewed.  Constitutional:  General: She is not in acute distress.    Appearance: She is well-developed. She is not diaphoretic.  HENT:     Head: Normocephalic and atraumatic.  Cardiovascular:     Rate and Rhythm: Normal rate and regular rhythm.     Heart sounds: No murmur heard.    No friction rub. No gallop.  Pulmonary:     Effort: Pulmonary effort is normal. No respiratory distress.     Breath sounds: Normal breath sounds. No wheezing.  Abdominal:     General: Bowel sounds are normal. There is no distension.     Palpations: Abdomen is soft.     Tenderness: There is no abdominal tenderness.  Musculoskeletal:        General: Normal range of motion.     Cervical back: Normal range of motion  and neck supple.     Right lower leg: No tenderness. No edema.     Left lower leg: No tenderness. No edema.  Skin:    General: Skin is warm and dry.  Neurological:     General: No focal deficit present.     Mental Status: She is alert and oriented to person, place, and time.     ED Results / Procedures / Treatments   Labs (all labs ordered are listed, but only abnormal results are displayed) Labs Reviewed  BASIC METABOLIC PANEL - Abnormal; Notable for the following components:      Result Value   Potassium 3.3 (*)    All other components within normal limits  CBC - Abnormal; Notable for the following components:   RDW 16.3 (*)    All other components within normal limits  LIPASE, BLOOD  TROPONIN I (HIGH SENSITIVITY)  TROPONIN I (HIGH SENSITIVITY)    EKG EKG Interpretation  Date/Time:  Tuesday August 24 2022 18:24:23 EDT Ventricular Rate:  74 PR Interval:  148 QRS Duration: 80 QT Interval:  396 QTC Calculation: 439 R Axis:   77 Text Interpretation: Normal sinus rhythm Normal ECG No previous ECGs available Confirmed by Geoffery Lyons ([id number]) on 08/24/2022 11:09:37 PM  Radiology DG Chest 2 View  Result Date: 08/24/2022 CLINICAL DATA:  Chest pain EXAM: CHEST - 2 VIEW COMPARISON:  None Available. FINDINGS: A mild pectus excavatum deformity. Midline trachea. Normal heart size and mediastinal contours. No pleural effusion or pneumothorax. Mild biapical scarring. IMPRESSION: No acute cardiopulmonary disease. Electronically Signed   By: Jeronimo Greaves M.D.   On: 08/24/2022 19:16    Procedures Procedures    Medications Ordered in ED Medications - No data to display  ED Course/ Medical Decision Making/ A&P  Patient is a 57 year old female presenting with complaints of chest discomfort as described in the HPI.  Patient arrives here with stable vital signs and is afebrile.  There is no hypoxia.  Her physical examination is basically unremarkable.  Workup initiated including CBC,  metabolic panel, and troponin x 2, all of which are unremarkable.  Chest x-ray shows no acute process.  Patient's symptoms atypical for cardiac pain and I highly suspect a GI etiology.  Her workup is negative and she has no significant cardiac risk factors.  At this point I feel as though she can safely be discharged and that a cardiac etiology is very unlikely.  Patient is to follow-up with her gastroenterologist tomorrow as scheduled.  Final Clinical Impression(s) / ED Diagnoses Final diagnoses:  None    Rx / DC Orders ED Discharge Orders     None  Geoffery Lyons, MD 08/24/22 [id number]

## 2022-08-24 NOTE — Discharge Instructions (Signed)
Continue medications as previously prescribed.  Follow-up with your gastroenterologist tomorrow as scheduled.

## 2022-08-25 ENCOUNTER — Telehealth: Payer: Self-pay | Admitting: *Deleted

## 2022-08-25 ENCOUNTER — Encounter: Payer: Self-pay | Admitting: Gastroenterology

## 2022-08-25 ENCOUNTER — Ambulatory Visit (INDEPENDENT_AMBULATORY_CARE_PROVIDER_SITE_OTHER): Payer: BC Managed Care – PPO | Admitting: Gastroenterology

## 2022-08-25 VITALS — BP 120/75 | HR 64 | Temp 98.0°F | Ht 64.0 in | Wt 161.4 lb

## 2022-08-25 DIAGNOSIS — R1031 Right lower quadrant pain: Secondary | ICD-10-CM

## 2022-08-25 DIAGNOSIS — K5190 Ulcerative colitis, unspecified, without complications: Secondary | ICD-10-CM | POA: Diagnosis not present

## 2022-08-25 DIAGNOSIS — R1030 Lower abdominal pain, unspecified: Secondary | ICD-10-CM | POA: Diagnosis not present

## 2022-08-25 DIAGNOSIS — R198 Other specified symptoms and signs involving the digestive system and abdomen: Secondary | ICD-10-CM | POA: Insufficient documentation

## 2022-08-25 DIAGNOSIS — R1032 Left lower quadrant pain: Secondary | ICD-10-CM

## 2022-08-25 DIAGNOSIS — K5900 Constipation, unspecified: Secondary | ICD-10-CM

## 2022-08-25 NOTE — Patient Instructions (Signed)
Samples of Linzess provided. You can take once daily as needed to help with constipation.  CT scan of your abdomen and pelvis. We will be in touch with results as available.  Continue pantoprazole twice daily before breakfast and evening meal for now. You can also use Pepcid 20 mg twice daily as needed AND TUMS as needed.  Avoid fatty foods, limit dairy otherwise try adding in some fruit, cooked soft vegetables to slowly increase your fiber intake.   It was a pleasure to see you today.  I truly value your feedback, so please be on the lookout for a survey regarding your visit with me today. I appreciate your time in completing this!

## 2022-08-25 NOTE — Telephone Encounter (Signed)
[place]lon PA: Order ID: [id number]       Completed  Approval Valid Through: 08/25/2022 - 10/23/2022

## 2022-08-25 NOTE — Telephone Encounter (Signed)
Pt informed that CT is scheduled for 08/26/22, arrive at 1 pm at [facility] [address] [place], arrive a 1 pm to check in, No food 4 hours prior but can have liquids. Pt needs to go to pick up contrast today before 6:30 pm. Verbalized understanding.

## 2022-08-25 NOTE — Progress Notes (Signed)
GI Office Note    Referring Provider: Richardean Chimera, MD Primary Care Physician:  Richardean Chimera, MD  Primary Gastroenterologist: Roetta Sessions, MD   Chief Complaint   Chief Complaint  Patient presents with   Constipation    Also having issues with gas and bloating.    History of Present Illness   Debra Taylor is a 57 y.o. female presenting today for follow up. Last seen by virtual visit 01/2022. She has h/o UC, diagnosed over 23 years ago. Last colonoscopy in 02/2022 as outlined below. Surveillance colonoscopy planned for 02/2024.   Has been on Weight [employer] since 01/2022, has lost 23 pounds. Has increased protein intake, using artificial sweeteners. Typically has Muscle Milk for breakfast for past six months. Eats some fruit but not a lot of veggies. Meat for supper. Three weeks ago started having a lot of issues with bloating, feeling of trapped gas. No abdominal cramping. Difficulty moving her bowels. She switched to [employer]. Has taken colace, miralax, two bottles of mag citrate, finally passed some stool. When she tries to eat, then gets indigestion, bloating, trapped gas, and chest discomfort. Thought she was having a heart attack and went to ED (see below). Has tried pantoprazole, pepcid, tums. Usually takes pantoprazole only when needed, about once per week but more frequently recently. Started a probiotic recently.  Has seen PCP for abd xray and was told it was ok. Patient has made appt to see her gyn. Her mother is an ovarian cancer survivor. Continues on apriso two bid.   Patient seen in the ED yesterday for chest pain.  Suspected to be GI etiology.  Troponin I normal at 2-->2, lipase 44, white blood cell count 6500, hemoglobin 13.2, MCV 90.8, platelets 253,000, potassium 3.3, BUN 14, creatinine 0.75.  Chest x-ray negative.  Colonoscopy 02/2022: -subtle colonic mucosal changes c/w inactive ulcerative colitis, inactive chronic colitis noted in descending colon and  sigmoid colon on bx. -subtle rectal mucosal changes. 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   Medications   Current Outpatient Medications  Medication Sig Dispense Refill   cetirizine (ZYRTEC) 10 MG tablet Take 10 mg by mouth daily as needed for allergies.     docusate sodium (COLACE) 100 MG capsule Take 100 mg by mouth daily as needed for mild constipation.     doxycycline (VIBRAMYCIN) 50 MG capsule Take 50 mg by mouth daily.      loratadine (CLARITIN) 10 MG tablet Take 10 mg by mouth daily as needed for allergies.     mesalamine (APRISO) 0.375 g 24 hr capsule Take 4 capsules (1.5 g total) by mouth daily. (Patient taking differently: Take 750 mg by mouth in the morning and at bedtime.) 180 capsule 6   mometasone (NASONEX) 50 MCG/ACT nasal spray Place 2 sprays into the nose daily.     pantoprazole (PROTONIX) 40 MG tablet Take 1 tablet (40 mg total) by mouth daily as needed (heartburn). 30 tablet 5   Propylene Glycol (SYSTANE BALANCE) 0.6 % SOLN Place 1 drop into both eyes as needed (dry eyes).     rizatriptan (MAXALT-MLT) 10 MG disintegrating tablet Take 10 mg by mouth as needed for migraine.     No current facility-administered medications for this visit.    Allergies   Allergies as of 08/25/2022   (No Known Allergies)       Review of Systems   General: Negative for anorexia, unintentional weight loss, fever, chills, fatigue,  weakness. ENT: Negative for hoarseness, difficulty swallowing , nasal congestion. CV: Negative for chest pain, angina, palpitations, dyspnea on exertion, peripheral edema.  Respiratory: Negative for dyspnea at rest, dyspnea on exertion, cough, sputum, wheezing.  GI: See history of present illness. GU:  Negative for dysuria, hematuria, urinary incontinence, urinary frequency, nocturnal urination.  Endo: Negative for unusual weight change.     Physical Exam   BP 120/75 (BP Location: Right Arm, Patient Position: Sitting, Cuff  Size: Normal)   Pulse 64   Temp 98 F (36.7 C) (Oral)   Ht 5\' 4"  (1.626 m)   Wt 161 lb 6.4 oz (73.2 kg)   LMP 05/22/2021 (Approximate)   SpO2 99%   BMI 27.70 kg/m    General: Well-nourished, well-developed in no acute distress.  Eyes: No icterus. Mouth: Oropharyngeal mucosa moist and pink   Abdomen: Bowel sounds are normal,   nondistended, no hepatosplenomegaly or masses,  no abdominal bruits or hernia , no rebound or guarding. Mild lower abdominal tenderness.  Rectal: not performed Extremities: No lower extremity edema. No clubbing or deformities. Neuro: Alert and oriented x 4   Skin: Warm and dry, no jaundice.   Psych: Alert and cooperative, normal mood and affect.  Labs   See hpi  Imaging Studies   DG Chest 2 View  Result Date: 08/24/2022 CLINICAL DATA:  Chest pain EXAM: CHEST - 2 VIEW COMPARISON:  None Available. FINDINGS: A mild pectus excavatum deformity. Midline trachea. Normal heart size and mediastinal contours. No pleural effusion or pneumothorax. Mild biapical scarring. IMPRESSION: No acute cardiopulmonary disease. Electronically Signed   By: Jeronimo Greaves M.D.   On: 08/24/2022 19:16    Assessment   Lower abdominal pain/change in bowels: reason for exacerbation unclear. Having constipation which is unusual for her. May be due to dietary changes. Constipation likely contributing to her abdominal pain/bloating but given her history of UC, FH of ovarian cancer, recommend imaging to evaluate. CT would give US imaging to evaluate her UC and to rule out ovarian lesions. Add Linzess.   GERD/indigestion: for now take pantoprazole regularly (BID) until symptoms settles down. May use Pepcid and/or TUMS as needed.   UC: no typical symptoms of flare. Colonoscopy up to date.   PLAN   Continue Apriso four daily. Pantoprazole 40mg  BID for now. May use Pepcid and/or TUMS for breakthrough symptoms.  Avoid fatty foods, limit dairy but start adding in some fruit, cooked soft  vegetables to slowly increase fiber intake.  CT A/P with contrast.    Leanna Battles. Melvyn Neth, MHS, PA-C [facility]

## 2022-08-26 ENCOUNTER — Ambulatory Visit
Admission: RE | Admit: 2022-08-26 | Discharge: 2022-08-26 | Disposition: A | Discharge status: Home / Self Care | Payer: BC Managed Care – PPO | Source: Ambulatory Visit | Attending: Gastroenterology | Admitting: Gastroenterology

## 2022-08-26 DIAGNOSIS — R1032 Left lower quadrant pain: Secondary | ICD-10-CM | POA: Diagnosis not present

## 2022-08-26 DIAGNOSIS — R1031 Right lower quadrant pain: Secondary | ICD-10-CM | POA: Diagnosis not present

## 2022-08-26 DIAGNOSIS — K5190 Ulcerative colitis, unspecified, without complications: Secondary | ICD-10-CM | POA: Diagnosis not present

## 2022-08-26 DIAGNOSIS — D259 Leiomyoma of uterus, unspecified: Secondary | ICD-10-CM | POA: Diagnosis not present

## 2022-08-26 MED ORDER — IOPAMIDOL (ISOVUE-300) INJECTION 61%
100.0000 mL | Freq: Once | INTRAVENOUS | Status: AC | PRN
  Administered 2022-08-26: 100 mL via INTRAVENOUS

## 2022-09-01 ENCOUNTER — Ambulatory Visit: Payer: BC Managed Care – PPO | Admitting: Gastroenterology

## 2022-09-03 ENCOUNTER — Other Ambulatory Visit: Payer: Self-pay | Admitting: Gastroenterology

## 2022-09-03 MED ORDER — SUCRALFATE 1 GM/10ML PO SUSP: 1.0000 g | Freq: Three times a day (TID) | ORAL | 0 refills | Status: DC

## 2022-09-16 NOTE — Telephone Encounter (Signed)
Tammy, can we see if we can get patient in next week. She is on my schedule a couple of weeks out but due to ongoing symptoms I would like to see her sooner.

## 2022-09-20 ENCOUNTER — Ambulatory Visit: Payer: BC Managed Care – PPO | Admitting: Gastroenterology

## 2022-09-20 ENCOUNTER — Encounter: Payer: Self-pay | Admitting: Gastroenterology

## 2022-09-20 VITALS — BP 137/81 | HR 67 | Temp 98.8°F | Ht 64.0 in | Wt 162.0 lb

## 2022-09-20 DIAGNOSIS — R140 Abdominal distension (gaseous): Secondary | ICD-10-CM | POA: Diagnosis not present

## 2022-09-20 DIAGNOSIS — R079 Chest pain, unspecified: Secondary | ICD-10-CM

## 2022-09-20 DIAGNOSIS — K219 Gastro-esophageal reflux disease without esophagitis: Secondary | ICD-10-CM | POA: Insufficient documentation

## 2022-09-20 DIAGNOSIS — K30 Functional dyspepsia: Secondary | ICD-10-CM

## 2022-09-20 DIAGNOSIS — K5190 Ulcerative colitis, unspecified, without complications: Secondary | ICD-10-CM

## 2022-09-20 NOTE — Patient Instructions (Addendum)
Please complete labs.  We will be in touch with results as available. Continue pantoprazole 40 mg twice daily at breakfast and before evening meal. Continue mesalamine 4 capsules daily. Continue Tums or Pepcid as needed per package insert. Upper endoscopy to be scheduled.
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
## 2022-09-21 ENCOUNTER — Encounter: Payer: Self-pay | Admitting: *Deleted

## 2022-09-21 DIAGNOSIS — K30 Functional dyspepsia: Secondary | ICD-10-CM | POA: Diagnosis not present

## 2022-09-21 DIAGNOSIS — R140 Abdominal distension (gaseous): Secondary | ICD-10-CM | POA: Diagnosis not present

## 2022-09-21 DIAGNOSIS — K5190 Ulcerative colitis, unspecified, without complications: Secondary | ICD-10-CM | POA: Diagnosis not present

## 2022-09-21 DIAGNOSIS — R079 Chest pain, unspecified: Secondary | ICD-10-CM | POA: Diagnosis not present

## 2022-09-22 ENCOUNTER — Telehealth: Payer: Self-pay

## 2022-09-22 NOTE — Telephone Encounter (Signed)
Pt's labs are in her chart

## 2022-09-23 NOTE — Telephone Encounter (Signed)
noted 

## 2022-09-24 ENCOUNTER — Telehealth: Payer: Self-pay

## 2022-09-24 LAB — CBC WITH DIFFERENTIAL/PLATELET
Basophils Absolute: 0 10*3/uL (ref 0.0–0.2)
Basos: 0 %
EOS (ABSOLUTE): 0.1 10*3/uL (ref 0.0–0.4)
Eos: 3 %
Hematocrit: 38.8 % (ref 34.0–46.6)
Hemoglobin: 12.4 g/dL (ref 11.1–15.9)
Immature Grans (Abs): 0 10*3/uL (ref 0.0–0.1)
Immature Granulocytes: 0 %
Lymphocytes Absolute: 1.7 10*3/uL (ref 0.7–3.1)
Lymphs: 35 %
MCH: 28.3 pg (ref 26.6–33.0)
MCHC: 32 g/dL (ref 31.5–35.7)
MCV: 89 fL (ref 79–97)
Monocytes Absolute: 0.5 10*3/uL (ref 0.1–0.9)
Monocytes: 10 %
Neutrophils Absolute: 2.5 10*3/uL (ref 1.4–7.0)
Neutrophils: 52 %
Platelets: 285 10*3/uL (ref 150–450)
RBC: 4.38 x10E6/uL (ref 3.77–5.28)
RDW: 15.5 % — ABNORMAL HIGH (ref 11.7–15.4)
WBC: 4.8 10*3/uL (ref 3.4–10.8)

## 2022-09-24 LAB — COMPREHENSIVE METABOLIC PANEL
ALT: 11 IU/L (ref 0–32)
AST: 18 IU/L (ref 0–40)
Albumin: 4 g/dL (ref 3.8–4.9)
Alkaline Phosphatase: 94 IU/L (ref 44–121)
BUN/Creatinine Ratio: 15 (ref 9–23)
BUN: 12 mg/dL (ref 6–24)
Bilirubin Total: 0.4 mg/dL (ref 0.0–1.2)
CO2: 26 mmol/L (ref 20–29)
Calcium: 9.2 mg/dL (ref 8.7–10.2)
Chloride: 107 mmol/L — ABNORMAL HIGH (ref 96–106)
Creatinine, Ser: 0.82 mg/dL (ref 0.57–1.00)
Globulin, Total: 2.9 g/dL (ref 1.5–4.5)
Glucose: 93 mg/dL (ref 70–99)
Potassium: 4 mmol/L (ref 3.5–5.2)
Sodium: 147 mmol/L — ABNORMAL HIGH (ref 134–144)
Total Protein: 6.9 g/dL (ref 6.0–8.5)
eGFR: 83 mL/min/{1.73_m2} (ref >59)

## 2022-09-24 LAB — ALPHA-GAL PANEL
Allergen Lamb IgE: <0.1 kU/L
Beef IgE: <0.1 kU/L
IgE (Immunoglobulin E), Serum: 34 IU/mL (ref 6–495)
O215-IgE Alpha-Gal: <0.1 kU/L
Pork IgE: <0.1 kU/L

## 2022-09-24 LAB — SEDIMENTATION RATE: Sed Rate: 4 mm/hr (ref 0–40)

## 2022-09-24 LAB — HEPATIC FUNCTION PANEL: Bilirubin, Direct: 0.12 mg/dL (ref 0.00–0.40)

## 2022-09-24 LAB — TISSUE TRANSGLUTAMINASE, IGA: Transglutaminase IgA: <2 U/mL (ref 0–3)

## 2022-09-24 LAB — C-REACTIVE PROTEIN: CRP: 2 mg/L (ref 0–10)

## 2022-09-24 LAB — LIPASE: Lipase: 33 U/L (ref 14–72)

## 2022-09-24 LAB — IGA: IgA/Immunoglobulin A, Serum: 186 mg/dL (ref 87–352)

## 2022-09-24 NOTE — Telephone Encounter (Signed)
Pt's labs are in the chart

## 2022-09-28 ENCOUNTER — Ambulatory Visit: Payer: BC Managed Care – PPO | Admitting: Gastroenterology

## 2022-10-04 DIAGNOSIS — F331 Major depressive disorder, recurrent, moderate: Secondary | ICD-10-CM | POA: Diagnosis not present

## 2022-10-04 DIAGNOSIS — R002 Palpitations: Secondary | ICD-10-CM | POA: Diagnosis not present

## 2022-10-04 DIAGNOSIS — Z6826 Body mass index (BMI) 26.0-26.9, adult: Secondary | ICD-10-CM | POA: Diagnosis not present

## 2022-10-11 DIAGNOSIS — L570 Actinic keratosis: Secondary | ICD-10-CM | POA: Diagnosis not present

## 2022-10-11 DIAGNOSIS — L719 Rosacea, unspecified: Secondary | ICD-10-CM | POA: Diagnosis not present

## 2022-10-14 ENCOUNTER — Ambulatory Visit (HOSPITAL_COMMUNITY): Payer: BC Managed Care – PPO | Admitting: Anesthesiology

## 2022-10-14 ENCOUNTER — Encounter (HOSPITAL_COMMUNITY): Payer: Self-pay | Admitting: Internal Medicine

## 2022-10-14 ENCOUNTER — Encounter (HOSPITAL_COMMUNITY)
Admission: RE | Disposition: A | Discharge status: Home / Self Care | Payer: Self-pay | Source: Ambulatory Visit | Attending: Internal Medicine

## 2022-10-14 ENCOUNTER — Ambulatory Visit (HOSPITAL_COMMUNITY)
Admission: RE | Admit: 2022-10-14 | Discharge: 2022-10-14 | Disposition: A | Discharge status: Home / Self Care | Payer: BC Managed Care – PPO | Source: Ambulatory Visit | Attending: Internal Medicine | Admitting: Internal Medicine

## 2022-10-14 ENCOUNTER — Other Ambulatory Visit: Payer: Self-pay

## 2022-10-14 DIAGNOSIS — K317 Polyp of stomach and duodenum: Secondary | ICD-10-CM | POA: Insufficient documentation

## 2022-10-14 DIAGNOSIS — K449 Diaphragmatic hernia without obstruction or gangrene: Secondary | ICD-10-CM | POA: Diagnosis not present

## 2022-10-14 DIAGNOSIS — Z79899 Other long term (current) drug therapy: Secondary | ICD-10-CM | POA: Insufficient documentation

## 2022-10-14 DIAGNOSIS — R140 Abdominal distension (gaseous): Secondary | ICD-10-CM | POA: Diagnosis not present

## 2022-10-14 DIAGNOSIS — K219 Gastro-esophageal reflux disease without esophagitis: Secondary | ICD-10-CM | POA: Insufficient documentation

## 2022-10-14 DIAGNOSIS — F32A Depression, unspecified: Secondary | ICD-10-CM | POA: Diagnosis not present

## 2022-10-14 DIAGNOSIS — K30 Functional dyspepsia: Secondary | ICD-10-CM

## 2022-10-14 DIAGNOSIS — R1013 Epigastric pain: Secondary | ICD-10-CM | POA: Diagnosis not present

## 2022-10-14 HISTORY — PX: BIOPSY: SHX5522

## 2022-10-14 HISTORY — PX: POLYPECTOMY: SHX5525

## 2022-10-14 HISTORY — PX: ESOPHAGOGASTRODUODENOSCOPY (EGD) WITH PROPOFOL: SHX5813

## 2022-10-14 SURGERY — ESOPHAGOGASTRODUODENOSCOPY (EGD) WITH PROPOFOL
Anesthesia: General

## 2022-10-14 MED ORDER — PROPOFOL 10 MG/ML IV BOLUS
INTRAVENOUS | Status: DC | PRN
Start: 2022-10-14 — End: 2022-10-14
  Administered 2022-10-14: 30 mg via INTRAVENOUS
  Administered 2022-10-14: 20 mg via INTRAVENOUS
  Administered 2022-10-14: 200 mg via INTRAVENOUS

## 2022-10-14 MED ORDER — LACTATED RINGERS IV SOLN: INTRAVENOUS | Status: DC

## 2022-10-14 MED ORDER — LIDOCAINE HCL (CARDIAC) PF 100 MG/5ML IV SOSY
PREFILLED_SYRINGE | INTRAVENOUS | Status: DC | PRN
  Administered 2022-10-14: 50 mg via INTRAVENOUS

## 2022-10-14 NOTE — Transfer of Care (Signed)
Immediate Anesthesia Transfer of Care Note  Patient: Debra Taylor  Procedure(s) Performed: ESOPHAGOGASTRODUODENOSCOPY (EGD) WITH PROPOFOL BIOPSY POLYPECTOMY  Patient Location: Endoscopy Unit  Anesthesia Type:General  Level of Consciousness: awake and patient cooperative  Airway & Oxygen Therapy: Patient Spontanous Breathing  Post-op Assessment: Report given to RN and Post -op Vital signs reviewed and stable  Post vital signs: Reviewed and stable  Last Vitals:  Vitals Value Taken Time  BP 103/55 10/14/22 0912  Temp 36.9 C 10/14/22 0912  Pulse 69 10/14/22 0912  Resp 20 10/14/22 0912  SpO2 97 % 10/14/22 0912    Last Pain:  Vitals:   10/14/22 0912  TempSrc: Oral  PainSc: 0-No pain      Patients Stated Pain Goal: 8 (10/14/22 0732)  Complications: No notable events documented.

## 2022-10-14 NOTE — Discharge Instructions (Signed)
EGD Discharge instructions Please read the instructions outlined below and refer to this sheet in the next few weeks. These discharge instructions provide you with general information on caring for yourself after you leave the hospital. Your doctor may also give you specific instructions. While your treatment has been planned according to the most current medical practices available, unavoidable complications occasionally occur. If you have any problems or questions after discharge, please call your doctor. ACTIVITY You may resume your regular activity but move at a slower pace for the next 24 hours.  Take frequent rest periods for the next 24 hours.  Walking will help expel (get rid of) the air and reduce the bloated feeling in your abdomen.  No driving for 24 hours (because of the anesthesia (medicine) used during the test).  You may shower.  Do not sign any important legal documents or operate any machinery for 24 hours (because of the anesthesia used during the test).  NUTRITION Drink plenty of fluids.  You may resume your normal diet.  Begin with a light meal and progress to your normal diet.  Avoid alcoholic beverages for 24 hours or as instructed by your caregiver.  MEDICATIONS You may resume your normal medications unless your caregiver tells you otherwise.  WHAT YOU CAN EXPECT TODAY You may experience abdominal discomfort such as a feeling of fullness or "gas" pains.  FOLLOW-UP Your doctor will discuss the results of your test with you.  SEEK IMMEDIATE MEDICAL ATTENTION IF ANY OF THE FOLLOWING OCCUR: Excessive nausea (feeling sick to your stomach) and/or vomiting.  Severe abdominal pain and distention (swelling).  Trouble swallowing.  Temperature over 101 F (37.8 C).  Rectal bleeding or vomiting of blood.     Gastric polyp removed from your stomach today.  You have a small hiatal hernia.  Biopsies of your stomach lining taken.  Further recommendations to follow pending  review of pathology report  To complete your GI evaluation, we will schedule a right upper quadrant ultrasound to evaluate for occult gallstone disease.  Office visit with Tana Coast in 6 weeks  Add patient request, I called Blessing Askar at [phone] -reviewed findings and recommendations

## 2022-10-14 NOTE — Anesthesia Postprocedure Evaluation (Signed)
Anesthesia Post Note  Patient: Debra Taylor  Procedure(s) Performed: ESOPHAGOGASTRODUODENOSCOPY (EGD) WITH PROPOFOL BIOPSY POLYPECTOMY  Patient location during evaluation: Phase II Anesthesia Type: General Level of consciousness: awake and alert and oriented Pain management: pain level controlled Vital Signs Assessment: post-procedure vital signs reviewed and stable Respiratory status: spontaneous breathing, nonlabored ventilation and respiratory function stable Cardiovascular status: blood pressure returned to baseline and stable Postop Assessment: no apparent nausea or vomiting Anesthetic complications: no  No notable events documented.   Last Vitals:  Vitals:   10/14/22 0732 10/14/22 0912  BP: (!) 150/79 (!) 103/55  Pulse: 73 69  Resp: 15 20  Temp: 36.9 C 36.9 C  SpO2: 100% 97%    Last Pain:  Vitals:   10/14/22 0912  TempSrc: Oral  PainSc: 0-No pain                 Ashleyann Shoun C Riyaan Heroux

## 2022-10-14 NOTE — Anesthesia Procedure Notes (Signed)
Date/Time: 10/14/2022 9:03 AM  Performed by: Franco Nones, CRNAPre-anesthesia Checklist: Patient identified, Emergency Drugs available, Suction available, Timeout performed and Patient being monitored Patient Re-evaluated:Patient Re-evaluated prior to induction Oxygen Delivery Method: Non-rebreather mask

## 2022-10-14 NOTE — Interval H&P Note (Signed)
History and Physical Interval Note:  10/14/2022 8:47 AM  Debra Taylor  has presented today for surgery, with the diagnosis of GERD,indigestion,bloating,chest pain.  The various methods of treatment have been discussed with the patient and family. After consideration of risks, benefits and other options for treatment, the patient has consented to  Procedure(s) with comments: ESOPHAGOGASTRODUODENOSCOPY (EGD) WITH PROPOFOL (N/A) - 8:45 am, asa 2 as a surgical intervention.  The patient's history has been reviewed, patient examined, no change in status, stable for surgery.  I have reviewed the patient's chart and labs.  Questions were answered to the patient's satisfaction.     Debra Taylor  Increased Prozac to 40 mg has helped her GI symptoms.  Denies dysphagia.  Diagnostic EGD today per plan. The risks, benefits, limitations, alternatives and imponderables have been reviewed with the patient. Potential for esophageal dilation, biopsy, etc. have also been reviewed.  Questions have been answered. All parties agreeable.

## 2022-10-14 NOTE — Anesthesia Preprocedure Evaluation (Addendum)
Anesthesia Evaluation  Patient identified by MRN, date of birth, ID band Patient awake    Reviewed: Allergy & Precautions, H&P , NPO status , Patient's Chart, lab work & pertinent test results  Airway Mallampati: II  TM Distance: >3 FB Neck ROM: Full    Dental  (+) Dental Advisory Given, Missing   Pulmonary neg pulmonary ROS   Pulmonary exam normal breath sounds clear to auscultation       Cardiovascular negative cardio ROS Normal cardiovascular exam Rhythm:Regular Rate:Normal     Neuro/Psych  Headaches PSYCHIATRIC DISORDERS  Depression       GI/Hepatic Neg liver ROS, PUD,GERD  Medicated and Controlled,,Ulcerative colitis    Endo/Other  negative endocrine ROS    Renal/GU negative Renal ROS  negative genitourinary   Musculoskeletal negative musculoskeletal ROS (+)    Abdominal   Peds negative pediatric ROS (+)  Hematology negative hematology ROS (+)   Anesthesia Other Findings   Reproductive/Obstetrics negative OB ROS                             Anesthesia Physical Anesthesia Plan  ASA: 2  Anesthesia Plan: General   Post-op Pain Management: Minimal or no pain anticipated   Induction: Intravenous  PONV Risk Score and Plan: Treatment may vary due to age or medical condition  Airway Management Planned: Nasal Cannula and Natural Airway  Additional Equipment:   Intra-op Plan:   Post-operative Plan:   Informed Consent: I have reviewed the patients History and Physical, chart, labs and discussed the procedure including the risks, benefits and alternatives for the proposed anesthesia with the patient or authorized representative who has indicated his/her understanding and acceptance.     Dental advisory given  Plan Discussed with: CRNA and Surgeon  Anesthesia Plan Comments:        Anesthesia Quick Evaluation

## 2022-10-14 NOTE — Anesthesia Procedure Notes (Signed)
Date/Time: 10/14/2022 8:54 AM  Performed by: Franco Nones, CRNAPre-anesthesia Checklist: Patient identified, Emergency Drugs available, Suction available, Timeout performed and Patient being monitored Patient Re-evaluated:Patient Re-evaluated prior to induction Oxygen Delivery Method: Nasal Cannula

## 2022-10-14 NOTE — Op Note (Addendum)
[facility] Patient Name: Debra Taylor Procedure Date: 10/14/2022 8:30 AM MRN: [id number] Date of Birth: 06-08-1965 Attending MD: Gennette Pac , MD, [id number] CSN: [id number] Age: 57 Admit Type: Outpatient Procedure:                Upper GI endoscopy Indications:              Dyspepsia, Abdominal bloating Providers:                Gennette Pac, MD, Sheran Fava, Elinor Parkinson, Dyann Ruddle Referring MD:              Medicines:                Propofol per Anesthesia Complications:            No immediate complications. Estimated Blood Loss:     Estimated blood loss was minimal. Procedure:                Pre-Anesthesia Assessment:                           - Prior to the procedure, a History and Physical                            was performed, and patient medications and                            allergies were reviewed. The patient's tolerance of                            previous anesthesia was also reviewed. The risks                            and benefits of the procedure and the sedation                            options and risks were discussed with the patient.                            All questions were answered, and informed consent                            was obtained. Prior Anticoagulants: The patient has                            taken no anticoagulant or antiplatelet agents. ASA                            Grade Assessment: III - A patient with severe                            systemic disease. After reviewing the risks and  benefits, the patient was deemed in satisfactory                            condition to undergo the procedure.                           After obtaining informed consent, the endoscope was                            passed under direct vision. Throughout the                            procedure, the patient's blood pressure, pulse, and                             oxygen saturations were monitored continuously. The                            GIF-H190 ([id number]) scope was introduced through the                            mouth, and advanced to the second part of duodenum.                            The upper GI endoscopy was accomplished without                            difficulty. The patient tolerated the procedure                            well. Scope In: 9:00:45 AM Scope Out: 9:06:11 AM Total Procedure Duration: 0 hours 5 minutes 26 seconds  Findings:      The examined esophagus was normal. Stomach empty. Multiple large fundal       gland appearing polyps measuring 3 to 6 mm. No ulcer or infiltrating       process. Patent pylorus.      A small hiatal hernia was present.      The duodenal bulb, second portion of the duodenum and third portion of       the duodenum were normal. One of the polyps was biopsied/removed.       Gastric mucosa was also biopsied for histologic study. Impression:               - Normal esophagus.                           - Small hiatal hernia. Gastric polyp status post                            biopsy                           - Normal duodenal bulb, second portion of the                            duodenum and  third portion of the duodenum. Moderate Sedation:      Moderate (conscious) sedation was personally administered by an       anesthesia professional. The following parameters were monitored: oxygen       saturation, heart rate, blood pressure, respiratory rate, EKG, adequacy       of pulmonary ventilation, and response to care. Recommendation:           - Patient has a contact number available for                            emergencies. The signs and symptoms of potential                            delayed complications were discussed with the                            patient. Return to normal activities tomorrow.                            Written discharge instructions were provided to the                             patient.                           - Advance diet as tolerated. Follow-up on                            pathology. Gallbladder ultrasound to complete her                            GI evaluation. It is notable patient told me today                            that her PCP increased her Prozac to 40 mg daily                            and this has been associated with a global                            improvement in her GI symptoms. Further                            recommendations to follow. Procedure Code(s):        --- Professional ---                           [phone], Esophagogastroduodenoscopy, flexible,                            transoral; diagnostic, including collection of                            specimen(s) by brushing or washing, when performed                            (  separate procedure) Diagnosis Code(s):        --- Professional ---                           K44.9, Diaphragmatic hernia without obstruction or                            gangrene                           R10.13, Epigastric pain                           R14.0, Abdominal distension (gaseous) CPT copyright 2022 American Medical Association. All rights reserved. The codes documented in this report are preliminary and upon coder review may  be revised to meet current compliance requirements. Gerrit Friends. Thaddus Mcdowell, MD Gennette Pac, MD 10/14/2022 9:16:07 AM This report has been signed electronically. Number of Addenda: 0

## 2022-10-17 ENCOUNTER — Encounter: Payer: Self-pay | Admitting: Internal Medicine

## 2022-10-20 ENCOUNTER — Encounter (HOSPITAL_COMMUNITY): Payer: Self-pay | Admitting: Internal Medicine

## 2022-10-20 DIAGNOSIS — F419 Anxiety disorder, unspecified: Secondary | ICD-10-CM | POA: Diagnosis not present

## 2022-10-20 DIAGNOSIS — F331 Major depressive disorder, recurrent, moderate: Secondary | ICD-10-CM | POA: Diagnosis not present

## 2022-10-26 DIAGNOSIS — E7849 Other hyperlipidemia: Secondary | ICD-10-CM | POA: Diagnosis not present

## 2022-10-26 DIAGNOSIS — L719 Rosacea, unspecified: Secondary | ICD-10-CM | POA: Diagnosis not present

## 2022-10-26 DIAGNOSIS — Z23 Encounter for immunization: Secondary | ICD-10-CM | POA: Diagnosis not present

## 2022-10-26 DIAGNOSIS — K5100 Ulcerative (chronic) pancolitis without complications: Secondary | ICD-10-CM | POA: Diagnosis not present

## 2022-10-26 DIAGNOSIS — G43909 Migraine, unspecified, not intractable, without status migrainosus: Secondary | ICD-10-CM | POA: Diagnosis not present

## 2022-11-10 DIAGNOSIS — Z8041 Family history of malignant neoplasm of ovary: Secondary | ICD-10-CM | POA: Diagnosis not present

## 2022-11-10 DIAGNOSIS — N958 Other specified menopausal and perimenopausal disorders: Secondary | ICD-10-CM | POA: Diagnosis not present

## 2022-11-10 DIAGNOSIS — Z01419 Encounter for gynecological examination (general) (routine) without abnormal findings: Secondary | ICD-10-CM | POA: Diagnosis not present

## 2022-11-10 DIAGNOSIS — N951 Menopausal and female climacteric states: Secondary | ICD-10-CM | POA: Diagnosis not present

## 2022-11-12 ENCOUNTER — Telehealth: Payer: Self-pay

## 2022-11-12 NOTE — Telephone Encounter (Signed)
Pt called requesting refills on her protonix. Pt is requesting that they be sent to [facility]. Pt states that dosage was increased to BID. Pt was last seen 09/20/2022.

## 2022-11-15 MED ORDER — PANTOPRAZOLE SODIUM 40 MG PO TBEC: 40.0000 mg | DELAYED_RELEASE_TABLET | Freq: Two times a day (BID) | ORAL | 5 refills | Status: DC

## 2022-11-15 NOTE — Addendum Note (Signed)
Addended by: Tiffany Kocher on: 11/15/2022 12:26 PM   Modules accepted: Orders

## 2022-11-15 NOTE — Telephone Encounter (Signed)
Rx sent 
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
## 2022-11-24 ENCOUNTER — Ambulatory Visit (INDEPENDENT_AMBULATORY_CARE_PROVIDER_SITE_OTHER): Payer: BC Managed Care – PPO | Admitting: Gastroenterology

## 2022-11-24 ENCOUNTER — Encounter: Payer: Self-pay | Admitting: Gastroenterology

## 2022-11-24 VITALS — BP 105/70 | HR 77 | Temp 98.4°F | Ht 64.0 in | Wt 161.2 lb

## 2022-11-24 DIAGNOSIS — K219 Gastro-esophageal reflux disease without esophagitis: Secondary | ICD-10-CM

## 2022-11-24 DIAGNOSIS — R1010 Upper abdominal pain, unspecified: Secondary | ICD-10-CM | POA: Insufficient documentation

## 2022-11-24 DIAGNOSIS — K5190 Ulcerative colitis, unspecified, without complications: Secondary | ICD-10-CM | POA: Diagnosis not present

## 2022-11-24 DIAGNOSIS — F331 Major depressive disorder, recurrent, moderate: Secondary | ICD-10-CM | POA: Diagnosis not present

## 2022-11-24 DIAGNOSIS — K5900 Constipation, unspecified: Secondary | ICD-10-CM

## 2022-11-24 DIAGNOSIS — F419 Anxiety disorder, unspecified: Secondary | ICD-10-CM | POA: Diagnosis not present

## 2022-11-24 DIAGNOSIS — R140 Abdominal distension (gaseous): Secondary | ICD-10-CM

## 2022-11-24 NOTE — Patient Instructions (Signed)
Decrease pantoprazole to 40mg  daily at bedtime.  You can continue famotidine and/or TUMS as needed. Continue Miralax as needed for constipation.  Continue Preparation H per package label instructions.  Gallbladder ultrasound to be scheduled.  We will see you back in 12/2022 after your ultrasound.  Hope you have a great trip!

## 2022-11-29 DIAGNOSIS — H04123 Dry eye syndrome of bilateral lacrimal glands: Secondary | ICD-10-CM | POA: Diagnosis not present

## 2022-11-30 ENCOUNTER — Other Ambulatory Visit: Payer: Self-pay | Admitting: Gastroenterology

## 2022-12-02 ENCOUNTER — Ambulatory Visit (HOSPITAL_COMMUNITY)
Admission: RE | Admit: 2022-12-02 | Discharge: 2022-12-02 | Disposition: A | Discharge status: Home / Self Care | Payer: BC Managed Care – PPO | Source: Ambulatory Visit | Attending: Gastroenterology | Admitting: Gastroenterology

## 2022-12-02 DIAGNOSIS — K5190 Ulcerative colitis, unspecified, without complications: Secondary | ICD-10-CM | POA: Insufficient documentation

## 2022-12-02 DIAGNOSIS — R1010 Upper abdominal pain, unspecified: Secondary | ICD-10-CM | POA: Diagnosis not present

## 2022-12-02 DIAGNOSIS — K5900 Constipation, unspecified: Secondary | ICD-10-CM | POA: Diagnosis not present

## 2022-12-02 DIAGNOSIS — R140 Abdominal distension (gaseous): Secondary | ICD-10-CM | POA: Diagnosis not present

## 2022-12-02 DIAGNOSIS — K219 Gastro-esophageal reflux disease without esophagitis: Secondary | ICD-10-CM | POA: Diagnosis not present

## 2022-12-02 DIAGNOSIS — R109 Unspecified abdominal pain: Secondary | ICD-10-CM | POA: Diagnosis not present

## 2022-12-03 DIAGNOSIS — R1010 Upper abdominal pain, unspecified: Secondary | ICD-10-CM

## 2022-12-03 DIAGNOSIS — K838 Other specified diseases of biliary tract: Secondary | ICD-10-CM

## 2022-12-06 NOTE — Addendum Note (Signed)
Addended by: Armstead Peaks on: 12/06/2022 09:33 AM   Modules accepted: Orders

## 2022-12-06 NOTE — Telephone Encounter (Signed)
PA submitted via [employer]. PA approved for [facility]. [facility] is OON for imaging.  Order ID: [id number]       Completed  Approval Valid Through: 12/06/2022 - 02/03/2023

## 2022-12-06 NOTE — Telephone Encounter (Signed)
Ok. See result note for u/s. Ok to schedule MRCP for dilated common bule duct, upper abdominal pain

## 2022-12-08 ENCOUNTER — Ambulatory Visit
Admission: RE | Admit: 2022-12-08 | Discharge: 2022-12-08 | Disposition: A | Discharge status: Home / Self Care | Payer: BC Managed Care – PPO | Source: Ambulatory Visit | Attending: Gastroenterology | Admitting: Gastroenterology

## 2022-12-08 DIAGNOSIS — R1010 Upper abdominal pain, unspecified: Secondary | ICD-10-CM

## 2022-12-08 DIAGNOSIS — K838 Other specified diseases of biliary tract: Secondary | ICD-10-CM | POA: Diagnosis not present

## 2022-12-08 MED ORDER — GADOPICLENOL 0.5 MMOL/ML IV SOLN
7.0000 mL | Freq: Once | INTRAVENOUS | Status: AC | PRN
  Administered 2022-12-08: 7 mL via INTRAVENOUS

## 2022-12-21 ENCOUNTER — Ambulatory Visit: Payer: BC Managed Care – PPO | Admitting: Gastroenterology

## 2022-12-21 ENCOUNTER — Other Ambulatory Visit: Payer: Self-pay | Admitting: *Deleted

## 2022-12-21 ENCOUNTER — Encounter: Payer: Self-pay | Admitting: Gastroenterology

## 2022-12-21 VITALS — BP 122/76 | HR 76 | Temp 98.7°F | Ht 64.0 in | Wt 164.0 lb

## 2022-12-21 DIAGNOSIS — K5190 Ulcerative colitis, unspecified, without complications: Secondary | ICD-10-CM | POA: Diagnosis not present

## 2022-12-21 DIAGNOSIS — K219 Gastro-esophageal reflux disease without esophagitis: Secondary | ICD-10-CM

## 2022-12-21 DIAGNOSIS — R140 Abdominal distension (gaseous): Secondary | ICD-10-CM

## 2022-12-21 DIAGNOSIS — R002 Palpitations: Secondary | ICD-10-CM

## 2022-12-21 DIAGNOSIS — K5900 Constipation, unspecified: Secondary | ICD-10-CM | POA: Diagnosis not present

## 2022-12-21 DIAGNOSIS — R1010 Upper abdominal pain, unspecified: Secondary | ICD-10-CM

## 2022-12-21 NOTE — Patient Instructions (Signed)
Continue pantoprazole 40 mg twice daily before meals. Continue mesalamine (Apriso) 1.5 g daily. Continue famotidine 10 mg daily as needed for indigestion or heartburn. Continue Tums as needed. Referral to cardiology for palpitations. Continue to see your counselor for management of anxiety. Return office visit in 3 months to see Dr. Jena Gauss.  Please call sooner if you have any questions or concerns.

## 2022-12-21 NOTE — Progress Notes (Signed)
GI Office Note    Referring Provider: Richardean Chimera, MD Primary Care Physician:  Richardean Chimera, MD  Primary Gastroenterologist: Roetta Sessions, MD   Chief Complaint   Chief Complaint  Patient presents with   Follow-up    Doing well, no issues    History of Present Illness   Debra Taylor is a 57 y.o. female presenting today for follow up. Last seen 11/2022.  H/o UC, diagnosed 23 years ago. Surveillance colonoscopy due in 02/2024.   She has been seen several times in the past few months for increased bloating, feeling of trapped gas, early satiety, difficulty moving bowels, postprandial indigestion and chest discomfort.Tried colace, miralax, two bottles of magnesium citrate, pantoprazole, pepcid, tums. On WW since 01/2022 with significant dietary changes with increased protein, artificial sweeteners. Stress testing 10/2021, Low risk study. She had tried taking twice daily pantoprazole, and Carafate. Linzess did not help bloating/gas. TUMS and Pepcid helped a little. She has cut out artificial sweeteners. Pantoprazole in AM sometimes gave her sensation of increased gas and then taking TUMS with relief. Initially felt some of her symptoms improved with increasing Prozac for her anxiety but never felt like she reached her baseline. Weight has been stable.   Since her last ov, she completed RUQ U/S which showed prominent CBD measuring 8.28mm. She also had some findings s/o hepatic steatosis. MRI/MRCP  Today: States she tried cutting back on Prozac since last office visit but did not tolerate.  She is back on 80 mg daily.  She also tried cutting back on pantoprazole to 40 mg in the evenings only but again had flare of her reflux symptoms and had to go back to twice daily.  She believes her symptoms are in part due to anxiety.  She feels like she gets very tense in her abdomen and chest.  She tries to deep breathe, calming techniques which seems to help alleviate some of her symptoms.  She  was very anxious about recent bus trip to [place].  She states there was no reason to be anxious, trip was completely planned out with guides etc.  She noted every morning as she prepared to get on the bus, rushing around etc., she would feel her symptoms worsen.  Would start with pressure, indigestion in the chest, stomach feeling tight like her whole body was clinching.  She would start calming techniques and symptoms would settle down.  For the past 1 week being more aware of the situation, she has felt much improved.  She has required no additional over there on her antacids for indigestion.  She notes she is trying to find her MiraLAX pattern that best moves her bowels on a regular basis.  She still has some days of passing hard stools followed by soft stools.  May occur about once a week, during these episodes she will see a small amount of bright red blood on the toilet tissue.  No melena.  She mentions today episodes that she has been having on a regular basis, currently almost every night.  She wakes up with hot flashes, heart pounding, feels her whole body is clenched.  She uses ice packs under the arms and in between her legs to try to cool herself off.  This happens almost every night.  She states she has discussed this with her PCP feels like her heart is okay given stress test last year.  She has never seen a cardiologist.  She does have some  palpitations during the day but symptoms primarily waking her up at night.  She contributed it to nightmares she was having previously although this has improved significantly with Prozac.  She has also contributed her symptoms to anxiety although I am not sure that that is typical when her up from sleep.    She also notes that after her recent MRI abdomen, she had visible shakes involving arms and legs that took about 30 minutes to go away.  She did not feel that she was claustrophobic or anxious about the study but she believes that maybe her anxiety  contributed to this reaction.  She sees her counselor every 2 weeks, has an appointment today.    CT A/P with contrast 08/2022 IMPRESSION: -No acute findings within the abdomen or pelvis. -Small uterine fibroids.  RUQ U/S 11/2022: IMPRESSION: 1. No cholelithiasis or sonographic evidence for acute cholecystitis. 2. Common bile duct is prominent measuring 8.2 mm. Recommend correlation with LFTs. If there is concern for biliary obstruction, MRCP may be considered. 3. Increased hepatic echogenicity suggestive of steatosis.  MRI/MRCP 12/2022: IMPRESSION: 1. Minimal prominence of the extrahepatic common bile duct measuring up to 0.8 cm in caliber on today's examination, tapering smoothly to the ampulla without calculus or other obstruction identified. This is of doubtful clinical significance in the absence of clinical/biochemical evidence of cholestasis. 2. No gallstones. 3. No acute findings in the abdomen.   Labs negative for celiac and Alpha-gal. Other labs as outlined below.   Egd 10/2022: -normal esophagus -small hh -gastric polyp s/p bx fundic gland, gastric biopsy with no h.pylori -normal duodenal bulb, second portion of duodenum and third portion of duodenum  Colonoscopy 02/2022: -subtle colonic mucosal changes c/w inactive ulcerative colitis, inactive chronic colitis noted in descending colon and sigmoid colon on bx. -subtle rectal mucosal changes. 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        Wt Readings from Last 3 Encounters:  12/21/22 164 lb (74.4 kg)  11/24/22 161 lb 3.2 oz (73.1 kg)  10/14/22 155 lb (70.3 kg)       Medications   Current Outpatient Medications  Medication Sig Dispense Refill   calcium carbonate (TUMS - DOSED IN MG ELEMENTAL CALCIUM) 500 MG chewable tablet Chew 4 tablets by mouth 2 (two) times daily as needed for indigestion or heartburn. Pt takes 1000 mg as needed.     cetirizine (ZYRTEC) 10 MG tablet  Take 10 mg by mouth daily as needed for allergies.     doxycycline (VIBRAMYCIN) 50 MG capsule Take 50 mg by mouth daily.      famotidine (PEPCID) 10 MG tablet Take 10 mg by mouth daily as needed for heartburn or indigestion.     FLUoxetine (PROZAC) 40 MG capsule Take 80 mg by mouth daily.     fluticasone (FLONASE) 50 MCG/ACT nasal spray Place 1 spray into both nostrils daily as needed for allergies or rhinitis.     mesalamine (APRISO) 0.375 g 24 hr capsule Take 4 capsules (1.5 g total) by mouth daily. 180 capsule 6   pantoprazole (PROTONIX) 40 MG tablet TAKE 1 TABLET BY MOUTH ONCE DAILY AS NEEDED FOR HEARTBURN (Patient taking differently: Take 40 mg by mouth 2 (two) times daily before a meal.) 30 tablet 11   rizatriptan (MAXALT-MLT) 10 MG disintegrating tablet Take 10 mg by mouth as needed for migraine.     No current facility-administered medications for this visit.    Allergies   Allergies as of  12/21/2022   (No Known Allergies)     Review of Systems   General: Negative for anorexia, weight loss, fever, chills, fatigue, weakness. ENT: Negative for hoarseness, difficulty swallowing , nasal congestion. CV: Negative for chest pain, angina, dyspnea on exertion, peripheral edema.  See HPI Respiratory: Negative for dyspnea at rest, dyspnea on exertion, cough, sputum, wheezing.  GI: See history of present illness. GU:  Negative for dysuria, hematuria, urinary incontinence, urinary frequency, nocturnal urination.  Endo: Negative for unusual weight change.     Physical Exam   BP 122/76 (BP Location: Right Arm, Patient Position: Sitting, Cuff Size: Normal)   Pulse 76   Temp 98.7 F (37.1 C) (Oral)   Ht 5\' 4"  (1.626 m)   Wt 164 lb (74.4 kg)   LMP 05/22/2021 (Approximate)   SpO2 97%   BMI 28.15 kg/m    General: Well-nourished, well-developed in no acute distress.  Eyes: No icterus. Mouth: Oropharyngeal mucosa moist and pink    Abdomen: Bowel sounds are normal, nontender,  nondistended, no hepatosplenomegaly or masses,  no abdominal bruits or hernia , no rebound or guarding.  Rectal: Not performed Extremities: No lower extremity edema. No clubbing or deformities. Neuro: Alert and oriented x 4   Skin: Warm and dry, no jaundice.   Psych: Alert and cooperative, normal mood and affect.  Labs   Lab Results  Component Value Date   NA 147 (H) 09/21/2022   CL 107 (H) 09/21/2022   K 4.0 09/21/2022   CO2 26 09/21/2022   BUN 12 09/21/2022   CREATININE 0.82 09/21/2022   EGFR 83 09/21/2022   CALCIUM 9.2 09/21/2022   ALBUMIN 4.0 09/21/2022   GLUCOSE 93 09/21/2022   Lab Results  Component Value Date   ALT 11 09/21/2022   AST 18 09/21/2022   ALKPHOS 94 09/21/2022   BILITOT 0.4 09/21/2022   Lab Results  Component Value Date   WBC 4.8 09/21/2022   HGB 12.4 09/21/2022   HCT 38.8 09/21/2022   MCV 89 09/21/2022   PLT 285 09/21/2022   Lab Results  Component Value Date   CRP 2 09/21/2022   Lab Results  Component Value Date   ESRSEDRATE 4 09/21/2022   Lab Results  Component Value Date   LIPASE 33 09/21/2022    Imaging Studies   MR ABDOMEN MRCP W WO CONTAST  Result Date: 12/15/2022 CLINICAL DATA:  Dilated common bile duct, upper abdominal pain EXAM: MRI ABDOMEN WITHOUT AND WITH CONTRAST (INCLUDING MRCP) TECHNIQUE: Multiplanar multisequence MR imaging of the abdomen was performed both before and after the administration of intravenous contrast. Heavily T2-weighted images of the biliary and pancreatic ducts were obtained, and three-dimensional MRCP images were rendered by post processing. CONTRAST:  7 mL Vueway gadolinium contrast IV COMPARISON:  Right upper quadrant ultrasound, 12/02/2022, CT abdomen pelvis, 08/26/2022 FINDINGS: Lower chest: No acute abnormality. Hepatobiliary: No solid liver abnormality is seen. Simple, benign right liver cysts, for which no further follow-up or characterization is required. No gallstones or gallbladder wall thickening.  Minimal prominence of the extrahepatic common bile duct measuring up to 0.8 cm in caliber on today's examination, tapering smoothly to the ampulla without calculus or other obstruction identified. Pancreas: Unremarkable. No pancreatic ductal dilatation or surrounding inflammatory changes. Spleen: Normal in size without significant abnormality. Adrenals/Urinary Tract: Adrenal glands are unremarkable. Kidneys are normal, without renal calculi, solid lesion, or hydronephrosis. Stomach/Bowel: Stomach is within normal limits. No evidence of bowel wall thickening, distention, or inflammatory changes. Vascular/Lymphatic: No significant  vascular findings are present. No enlarged abdominal lymph nodes. Other: No abdominal wall hernia or abnormality. No ascites. Musculoskeletal: No acute or significant osseous findings. IMPRESSION: 1. Minimal prominence of the extrahepatic common bile duct measuring up to 0.8 cm in caliber on today's examination, tapering smoothly to the ampulla without calculus or other obstruction identified. This is of doubtful clinical significance in the absence of clinical/biochemical evidence of cholestasis. 2. No gallstones. 3. No acute findings in the abdomen. Electronically Signed   By: Jearld Lesch M.D.   On: 12/15/2022 21:30   US Abdomen Limited RUQ (LIVER/GB)  Result Date: 12/02/2022 CLINICAL DATA:  Elevated abdominal gas/bloating.  Pain. EXAM: ULTRASOUND ABDOMEN LIMITED RIGHT UPPER QUADRANT COMPARISON:  CT abdomen pelvis 08/26/2022 FINDINGS: Gallbladder: No gallstones or wall thickening visualized. No sonographic Murphy sign noted by sonographer. Common bile duct: Diameter: 8.2 mm, prominent Liver: Increased echogenicity. No focal lesion. Portal vein is patent on color Doppler imaging with normal direction of blood flow towards the liver. Other: None. IMPRESSION: 1. No cholelithiasis or sonographic evidence for acute cholecystitis. 2. Common bile duct is prominent measuring 8.2 mm.  Recommend correlation with LFTs. If there is concern for biliary obstruction, MRCP may be considered. 3. Increased hepatic echogenicity suggestive of steatosis. Electronically Signed   By: Annia Belt M.D.   On: 12/02/2022 21:51    Assessment   GERD -Continues with heartburn/indigestion associated with discomfort in the chest, does seem to improve with PPI and over-the-counter antacids.  Symptoms may be exacerbated by anxiety. -Continue pantoprazole 40 mg twice daily before meals.  She did not tolerate once daily dosing due to worsening symptoms -Continue over-the-counter antacids as needed. -EGD up-to-date -Work on techniques to manage her anxiety, she has an appointment with her counselor every 2 weeks  Abdominal pain/bloating -Associated with her reflux symptoms, constipation -Likely exacerbated by anxiety -Extensive evaluation as outlined above overall reassuring  Constipation -Continue adjusting MiraLAX dosing to optimize bowel function  Palpitations/atypical chest pain -Chest discomfort throughout the day that she contributes to indigestion, anxiety, somewhat improved with PPI and over-the-counter antacids as well as calming techniques -Reports low risk stress test last year, ordered by PCP, has not seen cardiology -Complains of palpitations at times during the day but mostly at nighttime, wakes up from sleep with hot flashes and notes heart pounding.  Nightmares improved with increased Prozac.  Less likely due to anxiety given symptoms waking her up from deep sleep.  Question sleep disorder or sleep apnea.  May benefit from seeing cardiology and wearing a heart monitor.  Ulcerative colitis -Appears to be in remission -Continue Apriso 1.5 g daily -Anoscopy up-to-date  Prominent common bile duct -Noted on right upper quadrant ultrasound last month -LFTs normal -MRI/MRCP showing minimally prominence of the extrahepatic common bile duct measuring 0.8 cm tapering smoothly to the  ampulla without stones or obstruction identified. -CT imaging in June with no biliary dilatation -Likely insignificant findings  PATIENT TO HAVE RETURN OV WITH DR. Jena Gauss IN  3 MONTHS.  Discussed with patient today, given her extensive evaluation, I would like for her to return in 3 months to see Dr. Jena Gauss especially if ongoing symptoms, to make sure that we are not overlooking anything.  This should give her ample time to see cardiology prior to her return visit with [place].    Leanna Battles. Melvyn Neth, MHS, PA-C [facility]

## 2022-12-24 DIAGNOSIS — F331 Major depressive disorder, recurrent, moderate: Secondary | ICD-10-CM | POA: Diagnosis not present

## 2022-12-24 DIAGNOSIS — F419 Anxiety disorder, unspecified: Secondary | ICD-10-CM | POA: Diagnosis not present

## 2022-12-29 ENCOUNTER — Encounter: Payer: Self-pay | Admitting: Cardiology

## 2022-12-29 ENCOUNTER — Ambulatory Visit: Payer: BC Managed Care – PPO

## 2022-12-29 ENCOUNTER — Ambulatory Visit: Payer: BC Managed Care – PPO | Attending: Cardiology | Admitting: Cardiology

## 2022-12-29 VITALS — BP 122/76 | HR 72 | Ht 64.0 in | Wt 165.0 lb

## 2022-12-29 DIAGNOSIS — R002 Palpitations: Secondary | ICD-10-CM

## 2022-12-29 NOTE — Progress Notes (Signed)
Clinical Summary Debra Taylor is a 57 y.o.female seen today as a new consult, referred by NP [facility] for the following medical problems  1.Palpitations - symptoms started about 6 months ago - feeling of heart pounding, increasing in frequency and severity - symptoms last few minutes. Can occur multiple times daily.  - she does report some history of anxiety, medical therapy per pcp - no coffee, decaf tea, rare sodas, no energy drinks. Infrequent EtoH - baseline EKG shows NSR     10/2021 GXT ordered by PCP at [facility]: no ischemia, exercised 8 minutes, [facility] treadmill score low risk at 8    Past Medical History:  Diagnosis Date   Chest pain    Depression    Hyperlipidemia    Migraine headache    Premenopausal patient    Conctraception   Ulcerative colitis    Onset at age 23     No Known Allergies   Current Outpatient Medications  Medication Sig Dispense Refill   calcium carbonate (TUMS - DOSED IN MG ELEMENTAL CALCIUM) 500 MG chewable tablet Chew 4 tablets by mouth 2 (two) times daily as needed for indigestion or heartburn. Pt takes 1000 mg as needed.     cetirizine (ZYRTEC) 10 MG tablet Take 10 mg by mouth daily as needed for allergies.     doxycycline (VIBRAMYCIN) 50 MG capsule Take 50 mg by mouth daily.      famotidine (PEPCID) 10 MG tablet Take 10 mg by mouth daily as needed for heartburn or indigestion.     FLUoxetine (PROZAC) 40 MG capsule Take 80 mg by mouth daily.     fluticasone (FLONASE) 50 MCG/ACT nasal spray Place 1 spray into both nostrils daily as needed for allergies or rhinitis.     mesalamine (APRISO) 0.375 g 24 hr capsule Take 4 capsules (1.5 g total) by mouth daily. 180 capsule 6   pantoprazole (PROTONIX) 40 MG tablet TAKE 1 TABLET BY MOUTH ONCE DAILY AS NEEDED FOR HEARTBURN (Patient taking differently: Take 40 mg by mouth 2 (two) times daily before a meal.) 30 tablet 11   rizatriptan (MAXALT-MLT) 10 MG disintegrating tablet Take 10 mg by mouth as needed for  migraine.     No current facility-administered medications for this visit.     Past Surgical History:  Procedure Laterality Date   BIOPSY  09/02/2017   Procedure: BIOPSY;  Surgeon: Corbin Ade, MD;  [facility];  Service: Endoscopy;;  ascending colon, transverse,   BIOPSY  01/23/2020   Procedure: BIOPSY;  Surgeon: Corbin Ade, MD;  [facility];  Service: Endoscopy;;   BIOPSY  02/15/2022   Procedure: BIOPSY;  Surgeon: Corbin Ade, MD;  [facility];  Service: Endoscopy;;   BIOPSY  10/14/2022   Procedure: BIOPSY;  Surgeon: Corbin Ade, MD;  [facility];  Service: Endoscopy;;   COLONOSCOPY  08/2009   normal terminal ileum/inflammatory changes of the rectum and colon with panproctocolitis. No dysplasia on bx.    COLONOSCOPY  09/16/2011   RMR: Findings/Biopsies consistent with chronically mild active pan ulcerative colitis-endoscopically mild appearing. Status post segmental biopsy   COLONOSCOPY N/A 08/30/2013   RMR: Endoscopically active distal proctocolitis, comparable biopsies. No dysplasia.   COLONOSCOPY N/A 08/28/2015   Procedure: COLONOSCOPY;  Surgeon: Corbin Ade, MD;  [facility];  Service: Endoscopy;  Laterality: N/A;  1215-moved to 1300   COLONOSCOPY N/A 09/02/2017   Procedure: COLONOSCOPY;  Surgeon:  Rourk, Gerrit Friends, MD;  [facility];  Service: Endoscopy;  Laterality: N/A;  12:00-rescheduled to 6/28 @ 7:30 per Raynelle Fanning   COLONOSCOPY N/A 01/23/2020   Procedure: COLONOSCOPY;  Surgeon: Corbin Ade, MD;  [facility];  Service: Endoscopy;  Laterality: N/A;  9:15   COLONOSCOPY WITH PROPOFOL N/A 02/15/2022   Procedure: COLONOSCOPY WITH PROPOFOL;  Surgeon: Corbin Ade, MD;  [facility];  Service: Endoscopy;  Laterality: N/A;  11:15am, asa 2   DENTAL SURGERY     ESOPHAGOGASTRODUODENOSCOPY (EGD) WITH PROPOFOL N/A 10/14/2022   Procedure: ESOPHAGOGASTRODUODENOSCOPY (EGD) WITH  PROPOFOL;  Surgeon: Corbin Ade, MD;  [facility];  Service: Endoscopy;  Laterality: N/A;  8:45 am, asa 2   POLYPECTOMY  01/23/2020   Procedure: POLYPECTOMY;  Surgeon: Corbin Ade, MD;  [facility];  Service: Endoscopy;;   POLYPECTOMY  10/14/2022   Procedure: POLYPECTOMY;  Surgeon: Corbin Ade, MD;  [facility];  Service: Endoscopy;;     No Known Allergies    Family History  Problem Relation Age of Onset   Uterine cancer Mother    Ovarian cancer Mother    Celiac disease Mother    Heart attack Father    Lung disease Father    Heart attack Brother    Colon cancer Neg Hx    Breast cancer Neg Hx    Lung cancer Neg Hx      Social History Debra Taylor reports that she has never smoked. She has never used smokeless tobacco. Debra Taylor reports current alcohol use.   Review of Systems CONSTITUTIONAL: No weight loss, fever, chills, weakness or fatigue.  HEENT: Eyes: No visual loss, blurred vision, double vision or yellow sclerae.No hearing loss, sneezing, congestion, runny nose or sore throat.  SKIN: No rash or itching.  CARDIOVASCULAR: per hpi RESPIRATORY: No shortness of breath, cough or sputum.  GASTROINTESTINAL: No anorexia, nausea, vomiting or diarrhea. No abdominal pain or blood.  GENITOURINARY: No burning on urination, no polyuria NEUROLOGICAL: No headache, dizziness, syncope, paralysis, ataxia, numbness or tingling in the extremities. No change in bowel or bladder control.  MUSCULOSKELETAL: No muscle, back pain, joint pain or stiffness.  LYMPHATICS: No enlarged nodes. No history of splenectomy.  PSYCHIATRIC: No history of depression or anxiety.  ENDOCRINOLOGIC: No reports of sweating, cold or heat intolerance. No polyuria or polydipsia.  Marland Kitchen   Physical Examination Today's Vitals   12/29/22 1034  BP: 122/76  Pulse: 72  SpO2: 97%  Weight: 165 lb (74.8 kg)  Height: 5\' 4"  (1.626 m)   Body mass index is 28.32 kg/m.  Gen:  resting comfortably, no acute distress HEENT: no scleral icterus, pupils equal round and reactive, no palptable cervical adenopathy,  CV: RRR, no m/rg, no jvd Resp: Clear to auscultation bilaterally GI: abdomen is soft, non-tender, non-distended, normal bowel sounds, no hepatosplenomegaly MSK: extremities are warm, no edema.  Skin: warm, no rash Neuro:  no focal deficits Psych: appropriate affect     Assessment and Plan  Palpitations - baseline EKG shows NSR - progressing symptoms, plan for 7 day zio patch - f/u with PA 6 weeks      Antoine Poche, M.D.

## 2022-12-29 NOTE — Patient Instructions (Signed)
Medication Instructions:  Your physician recommends that you continue on your current medications as directed. Please refer to the Current Medication list given to you today.  *If you need a refill on your cardiac medications before your next appointment, please call your pharmacy*   Lab Work: NOne If you have labs (blood work) drawn today and your tests are completely normal, you will receive your results only by: MyChart Message (if you have MyChart) OR A paper copy in the mail If you have any lab test that is abnormal or we need to change your treatment, we will call you to review the results.   Testing/Procedures: Zio   Follow-Up: At [facility], you and your health needs are our priority.  As part of our continuing mission to provide you with exceptional heart care, we have created designated Provider Care Teams.  These Care Teams include your primary Cardiologist (physician) and Advanced Practice Providers (APPs -  Physician Assistants and Nurse Practitioners) who all work together to provide you with the care you need, when you need it.  We recommend signing up for the patient portal called "MyChart".  Sign up information is provided on this After Visit Summary.  MyChart is used to connect with patients for Virtual Visits (Telemedicine).  Patients are able to view lab/test results, encounter notes, upcoming appointments, etc.  Non-urgent messages can be sent to your provider as well.   To learn more about what you can do with MyChart, go to [email or web address].    Your next appointment:   6 week(s)  Provider:   You will see one of the following Advanced Practice Providers on your designated Care Team:   Randall An, PA-C  Jacolyn Reedy, PA-C   Other Instructions Debra Taylor- Long Term Monitor Instructions   Your physician has requested you wear your ZIO patch monitor___7____days.   This is a single patch monitor.  [employer] supplies one patch monitor per  enrollment.  Additional stickers are not available.   Please do not apply patch if you will be having a Nuclear Stress Test, Echocardiogram, Cardiac CT, MRI, or Chest Xray during the time frame you would be wearing the monitor. The patch cannot be worn during these tests.  You cannot remove and re-apply the ZIO XT patch monitor.   Your ZIO patch monitor will be sent USPS Priority mail from [facility] directly to your home address. The monitor may also be mailed to a PO BOX if home delivery is not available.   It may take 3-5 days to receive your monitor after you have been enrolled.   Once you have received you monitor, please review enclosed instructions.  Your monitor has already been registered assigning a specific monitor serial # to you.   Applying the monitor   Shave hair from upper left chest.   Hold abrader disc by orange tab.  Rub abrader in 40 strokes over left upper chest as indicated in your monitor instructions.   Clean area with 4 enclosed alcohol pads .  Use all pads to assure are is cleaned thoroughly.  Let dry.   Apply patch as indicated in monitor instructions.  Patch will be place under collarbone on left side of chest with arrow pointing upward.   Rub patch adhesive wings for 2 minutes.Remove white label marked "1".  Remove white label marked "2".  Rub patch adhesive wings for 2 additional minutes.   While looking in a mirror, press and release button in center of patch.  A small green light will flash 3-4 times .  This will be your only indicator the monitor has been turned on.     Do not shower for the first 24 hours.  You may shower after the first 24 hours.   Press button if you feel a symptom. You will hear a small click.  Record Date, Time and Symptom in the Patient Log Book.   When you are ready to remove patch, follow instructions on last 2 pages of Patient Log Book.  Stick patch monitor onto last page of Patient Log Book.   Place Patient Log Book in  [place] box.  Use locking tab on box and tape box closed securely.  The [employer] and [employer] has [employer] on it.  Please place in mailbox as soon as possible.  Your physician should have your test results approximately 7 days after the monitor has been mailed back to [facility].   Call [facility] Customer Care at [phone] if you have questions regarding your ZIO XT patch monitor.  Call them immediately if you see an orange light blinking on your monitor.   If your monitor falls off in less than 4 days contact our Monitor department at [phone].  If your monitor becomes loose or falls off after 4 days call [employer] at [phone] for suggestions on securing your monitor.

## 2023-01-04 DIAGNOSIS — Z1329 Encounter for screening for other suspected endocrine disorder: Secondary | ICD-10-CM | POA: Diagnosis not present

## 2023-02-07 ENCOUNTER — Ambulatory Visit: Payer: BC Managed Care – PPO | Attending: Nurse Practitioner | Admitting: Nurse Practitioner

## 2023-02-07 ENCOUNTER — Encounter: Payer: Self-pay | Admitting: Nurse Practitioner

## 2023-02-07 VITALS — BP 122/72 | HR 60 | Ht 64.0 in | Wt 169.6 lb

## 2023-02-07 DIAGNOSIS — Z8249 Family history of ischemic heart disease and other diseases of the circulatory system: Secondary | ICD-10-CM

## 2023-02-07 DIAGNOSIS — I2510 Atherosclerotic heart disease of native coronary artery without angina pectoris: Secondary | ICD-10-CM

## 2023-02-07 DIAGNOSIS — R002 Palpitations: Secondary | ICD-10-CM

## 2023-02-07 LAB — BASIC METABOLIC PANEL
BUN/Creatinine Ratio: 26 — ABNORMAL HIGH (ref 9–23)
BUN: 17 mg/dL (ref 6–24)
CO2: 26 mmol/L (ref 20–29)
Calcium: 9.5 mg/dL (ref 8.7–10.2)
Chloride: 103 mmol/L (ref 96–106)
Creatinine, Ser: 0.65 mg/dL (ref 0.57–1.00)
Glucose: 127 mg/dL — ABNORMAL HIGH (ref 70–99)
Potassium: 4.4 mmol/L (ref 3.5–5.2)
Sodium: 140 mmol/L (ref 134–144)
eGFR: 103 mL/min/{1.73_m2} (ref >59)

## 2023-02-07 MED ORDER — METOPROLOL TARTRATE 25 MG PO TABS: 12.5000 mg | ORAL_TABLET | Freq: Two times a day (BID) | ORAL | 1 refills | Status: DC | PRN

## 2023-02-07 NOTE — Patient Instructions (Addendum)
Medication Instructions:  Your physician has recommended you make the following change in your medication:  Please start Metoprolol Tartrate 12.5 mg Twice daily as needed for Palpitations, or Top # of blood pressure higher than 120   Labwork: This week   Testing/Procedures:   Follow-Up: Your physician recommends that you schedule a follow-up appointment in: 8 weeks Debra Taylor   Any Other Special Instructions Will Be Listed Below (If Applicable).  If you need a refill on your cardiac medications before your next appointment, please call your pharmacy.

## 2023-02-07 NOTE — Progress Notes (Unsigned)
Cardiology Office Note:  .   Date:  02/07/2023 ID:  Debra Taylor, DOB 11/23/1965, MRN [id number] PCP: Richardean Chimera, MD  [facility] Providers Cardiologist:  Dina Rich, MD    History of Present Illness: Debra Taylor is a 57 y.o. female with a PMH of palpitations, chest pain, hyperlipidemia, migraine headaches, ulcerative colitis since age 86, anxiety, depression, who presents today for 6-week follow-up appointment.  GXT in 2023 at [facility] was negative for ischemia.  First evaluated by Dr. Dina Rich on December 29, 2022 for palpitations.  She described her palpitations beginning 6 months ago that were increasing in frequency and severity, would last several minutes and occur multiple times a day.  7-day Zio patch was arranged and revealed predominant sinus rhythm with 5 SVT runs, longest lasting 8 beats, rare PVCs/couplets, reported symptoms correlated to sinus rhythm.  Dr. Wyline Mood stated if she noted symptoms, could start Lopressor 12.5 mg twice daily.  Today she presents for 6-week follow-up.  She states she has not noticed her palpitations as much since last office visit.  She says she is now seeing a therapist that has helped reduce her anxiety.  Says she has not really noticed her symptoms within the past week. Denies any chest pain, shortness of breath, syncope, presyncope, dizziness, orthopnea, PND, swelling or significant weight changes, acute bleeding, or claudication.  ROS: Negative.  See HPI. Family history: (Dad side of family) -MI, CABG, hypertension, and hyperlipidemia.  History of TIAs -maternal grandmother.   Studies Reviewed: .    Cardiac monitor 01/2023:   7 day monitor   Rare supraventricular ectopy in the form of isolated PACs, couplets, triplets. 5 runs of SVT longest 8 beats.   Rare ventricular ectopy in the form of isolated PVCs, couplets   Reported symptoms correlated with normal sinus rhythm     Patch Wear Time:  6 days and 19 hours  (2024-10-23T11:04:12-398 to 2024-10-30T06:54:41-0400)   Patient had a min HR of 47 bpm, max HR of 182 bpm, and avg HR of 73 bpm. Predominant underlying rhythm was Sinus Rhythm. 5 Supraventricular Tachycardia runs occurred, the run with the fastest interval lasting 5 beats with a max rate of 182 bpm, the  longest lasting 8 beats with an avg rate of 125 bpm. Isolated SVEs were rare (<1.0%), SVE Couplets were rare (<1.0%), and SVE Triplets were rare (<1.0%). Isolated VEs were rare (<1.0%), VE Couplets were rare (<1.0%), and no VE Triplets were present.    Physical Exam:   VS:  BP 122/72   Pulse 60   Ht 5\' 4"  (1.626 m)   Wt 169 lb 9.6 oz (76.9 kg)   LMP 05/22/2021 (Approximate)   SpO2 99%   BMI 29.11 kg/m    Wt Readings from Last 3 Encounters:  02/07/23 169 lb 9.6 oz (76.9 kg)  12/29/22 165 lb (74.8 kg)  12/21/22 164 lb (74.4 kg)    GEN: Well nourished, well developed in no acute distress NECK: No JVD; No carotid bruits CARDIAC: S1/S2, RRR, no murmurs, rubs, gallops RESPIRATORY:  Clear to auscultation without rales, wheezing or rhonchi  ABDOMEN: Soft, non-tender, non-distended EXTREMITIES:  No edema; No deformity   ASSESSMENT AND PLAN: .    Palpitations Improved palpitations after seeing therapist.  Discussed/reviewed recent monitor results and Dr. Verna Czech recommendations.  Will start metoprolol to tartrate 12.5 mg twice daily as needed for palpitations.  Instructed/educated her regarding this medication and she verbalized understanding.  No other medication  changes at this time. Heart healthy diet and regular cardiovascular exercise encouraged.    Screening for cardiovascular disease, family history of ASCVD  Patient tells me that on her dad's side there is a history of MI, CABG, hypertension and hyperlipidemia.  History of TIAs with her maternal grandmother.  She would like to know her risk regarding cardiovascular disease and I discussed/recommended coronary calcium score.  She  verbalized understanding is agreeable to this.  Will arrange.  Will obtain BMET prior to testing.   Dispo: Follow-up with me/APP in 8 weeks or sooner if anything changes.  Signed, Sharlene Dory, NP

## 2023-02-14 ENCOUNTER — Ambulatory Visit (HOSPITAL_COMMUNITY)
Admission: RE | Admit: 2023-02-14 | Discharge: 2023-02-14 | Disposition: A | Discharge status: Home / Self Care | Payer: BC Managed Care – PPO | Source: Ambulatory Visit | Attending: Nurse Practitioner | Admitting: Nurse Practitioner

## 2023-02-14 DIAGNOSIS — Z8249 Family history of ischemic heart disease and other diseases of the circulatory system: Secondary | ICD-10-CM | POA: Insufficient documentation

## 2023-03-11 ENCOUNTER — Ambulatory Visit: Payer: BC Managed Care – PPO | Admitting: Internal Medicine

## 2023-04-11 NOTE — Progress Notes (Signed)
 Cardiology Office Note:  .   Date:  04/12/2023 ID:  Debra Taylor, DOB 17-Aug-1965, MRN [id number] PCP: Toribio Jerel MATSU, MD  [facility] Providers Cardiologist:  Alvan Carrier, MD    History of Present Illness: Debra Taylor is a 58 y.o. female with a PMH of palpitations, chest pain, migraine headaches, ulcerative colitis since age 17, anxiety, depression, who presents today for follow-up appointment.  GXT in 2023 at [facility] was negative for ischemia.  First evaluated by Dr. Carrier Alvan on December 29, 2022 for palpitations.  She described her palpitations beginning 6 months ago that were increasing in frequency and severity, would last several minutes and occur multiple times a day.  7-day Zio patch was arranged and revealed predominant sinus rhythm with 5 SVT runs, longest lasting 8 beats, rare PVCs/couplets, reported symptoms correlated to sinus rhythm.  Dr. Alvan stated if she noted symptoms, could start Lopressor  12.5 mg twice daily.  I last saw her for 6-week follow-up on February 07, 2023.  Had not noticed her palpitations as much since last office visit. Said she was seeing a therapist that had helped reduce her anxiety. Had not really noticed her symptoms within the past week.   Today she presents for follow-up.  Says she has been doing well since being started on metoprolol  tartrate as needed.  She is almost done with her therapy sessions and will decide where to go from there after her last session.  She still has moments of anxiety, says Lopressor  has helped her symptoms.  Wants to know if she can take this medication also at night as she experiences teeth grinding, says she is using a mouthguard, and experiences anxiety/palpitations at night. Denies any chest pain, shortness of breath, syncope, presyncope, dizziness, orthopnea, PND, swelling or significant weight changes, acute bleeding, or claudication.  ROS: Negative.  See HPI. Family history: (Dad side of family)  -MI, CABG, hypertension, and hyperlipidemia.  History of TIAs -maternal grandmother.  SH: Her husband is also my patient, recently he underwent 5 vessel CABG and is doing well postoperatively.  Studies Reviewed: SABRA    EKG:  EKG Interpretation Date/Time:  Tuesday April 12 2023 09:23:29 EST Ventricular Rate:  59 PR Interval:  170 QRS Duration:  82 QT Interval:  416 QTC Calculation: 411 R Axis:   72  Text Interpretation: Sinus bradycardia When compared with ECG of 24-Aug-2022 18:24, No significant change was found Confirmed by Miriam Norris [phone]) on 04/12/2023 9:39:34 AM    CT cardiac scoring 02/2023:  IMPRESSION: 1. Coronary calcium score of 0. This was 1st percentile for age, gender, and race matched controls.  IMPRESSION: 1. No significant incidental noncardiac findings are noted.  Cardiac monitor 01/2023:   7 day monitor   Rare supraventricular ectopy in the form of isolated PACs, couplets, triplets. 5 runs of SVT longest 8 beats.   Rare ventricular ectopy in the form of isolated PVCs, couplets   Reported symptoms correlated with normal sinus rhythm   Patch Wear Time:  6 days and 19 hours (2024-10-23T11:04:12-398 to 2024-10-30T06:54:41-0400)   Patient had a min HR of 47 bpm, max HR of 182 bpm, and avg HR of 73 bpm. Predominant underlying rhythm was Sinus Rhythm. 5 Supraventricular Tachycardia runs occurred, the run with the fastest interval lasting 5 beats with a max rate of 182 bpm, the  longest lasting 8 beats with an avg rate of 125 bpm. Isolated SVEs were rare (<1.0%), SVE Couplets were rare (<1.0%),  and SVE Triplets were rare (<1.0%). Isolated VEs were rare (<1.0%), VE Couplets were rare (<1.0%), and no VE Triplets were present.    Physical Exam:   VS:  BP 118/62   Pulse (!) 54   Ht 5' 4 (1.626 m)   Wt 177 lb 3.2 oz (80.4 kg)   LMP 05/22/2021 (Approximate)   SpO2 98%   BMI 30.42 kg/m    Wt Readings from Last 3 Encounters:  04/12/23 177 lb 3.2 oz (80.4 kg)   02/07/23 169 lb 9.6 oz (76.9 kg)  12/29/22 165 lb (74.8 kg)    GEN: Well nourished, well developed in no acute distress NECK: No JVD; No carotid bruits CARDIAC: S1/S2, RRR, no murmurs, rubs, gallops RESPIRATORY:  Clear to auscultation without rales, wheezing or rhonchi  ABDOMEN: Soft, non-tender, non-distended EXTREMITIES:  No edema; No deformity   ASSESSMENT AND PLAN: .    Palpitations Improved palpitations after seeing therapist and since on Lopressor .  Will change metoprolol  tartrate to 12.5 mg twice daily.  No other medication changes at this time. Heart healthy diet and regular cardiovascular exercise encouraged.  Continue to follow-up with therapist and PCP.  Dispo: Follow-up with me/APP in 6 months or sooner if anything changes.  Signed, Almarie Crate, NP

## 2023-04-12 ENCOUNTER — Ambulatory Visit: Payer: BC Managed Care – PPO | Attending: Nurse Practitioner | Admitting: Nurse Practitioner

## 2023-04-12 ENCOUNTER — Encounter: Payer: Self-pay | Admitting: Nurse Practitioner

## 2023-04-12 VITALS — BP 118/62 | HR 54 | Ht 64.0 in | Wt 177.2 lb

## 2023-04-12 DIAGNOSIS — R002 Palpitations: Secondary | ICD-10-CM

## 2023-04-12 MED ORDER — METOPROLOL TARTRATE 25 MG PO TABS: 12.5000 mg | ORAL_TABLET | Freq: Two times a day (BID) | ORAL | Status: DC

## 2023-04-12 NOTE — Patient Instructions (Addendum)

## 2023-05-09 ENCOUNTER — Other Ambulatory Visit: Payer: Self-pay | Admitting: Gastroenterology

## 2023-07-26 ENCOUNTER — Encounter: Payer: Self-pay | Admitting: Internal Medicine

## 2023-10-04 ENCOUNTER — Other Ambulatory Visit: Payer: Self-pay | Admitting: Nurse Practitioner

## 2023-10-11 ENCOUNTER — Ambulatory Visit: Payer: BC Managed Care – PPO | Attending: Nurse Practitioner | Admitting: Nurse Practitioner

## 2023-10-11 ENCOUNTER — Encounter: Payer: Self-pay | Admitting: Nurse Practitioner

## 2023-10-11 VITALS — BP 126/72 | HR 74 | Ht 64.0 in | Wt 182.2 lb

## 2023-10-11 DIAGNOSIS — R002 Palpitations: Secondary | ICD-10-CM | POA: Diagnosis not present

## 2023-10-11 MED ORDER — METOPROLOL SUCCINATE ER 25 MG PO TB24: 25.0000 mg | ORAL_TABLET | Freq: Every day | ORAL | 3 refills | Status: DC

## 2023-10-11 NOTE — Progress Notes (Unsigned)
 Cardiology Office Note:  .   Date: 10/11/2023 ID:  Debra Taylor, DOB [birth year], MRN [id number] PCP: Toribio Jerel MATSU, MD  [facility] Providers Cardiologist:  Alvan Carrier, MD    History of Present Illness: Debra Taylor is a 58 y.o. female with a PMH of palpitations, chest pain, migraine headaches, ulcerative colitis since age 86, anxiety, depression, who presents today for follow-up appointment.  GXT in 2023 at [facility] was negative for ischemia.  First evaluated by Dr. Carrier Alvan on December 29, 2022 for palpitations.  She described her palpitations beginning 6 months ago that were increasing in frequency and severity, would last several minutes and occur multiple times a day.  7-day Zio patch was arranged and revealed predominant sinus rhythm with 5 SVT runs, longest lasting 8 beats, rare PVCs/couplets, reported symptoms correlated to sinus rhythm.  Dr. Alvan stated if she noted symptoms, could start Lopressor  12.5 mg twice daily.  I last saw her for 6-week follow-up on February 07, 2023.  Had not noticed her palpitations as much since last office visit. Said she was seeing a therapist that had helped reduce her anxiety. Had not really noticed her symptoms within the past week.   04/12/2023 - Today she presents for follow-up.  Says she has been doing well since being started on metoprolol  tartrate as needed.  She is almost done with her therapy sessions and will decide where to go from there after her last session.  She still has moments of anxiety, says Lopressor  has helped her symptoms.  Wants to know if she can take this medication also at night as she experiences teeth grinding, says she is using a mouthguard, and experiences anxiety/palpitations at night. Denies any chest pain, shortness of breath, syncope, presyncope, dizziness, orthopnea, PND, swelling or significant weight changes, acute bleeding, or claudication.  10/11/2023 -  Here for follow-up. Was recently started  on hormone patch that has significantly resolved her palpations. She is starting to wean herself off metoprolol . Taking one tablet in the AM every other morning. Doing very well. Denies any recent chest pain, shortness of breath, palpitations, syncope, presyncope, dizziness, orthopnea, PND, swelling or significant weight changes, acute bleeding, or claudication.  ROS: Negative.  See HPI. Family history: (Dad side of family) -MI, CABG, hypertension, and hyperlipidemia.  History of TIAs -maternal grandmother.  SH: Her husband is also my patient, recently he underwent 5 vessel CABG and is doing well postoperatively.  Studies Reviewed: SABRA    EKG: EKG is not ordered today.       CT cardiac scoring 02/2023:  IMPRESSION: 1. Coronary calcium score of 0. This was 1st percentile for age, gender, and race matched controls.  IMPRESSION: 1. No significant incidental noncardiac findings are noted.  Cardiac monitor 01/2023:   7 day monitor   Rare supraventricular ectopy in the form of isolated PACs, couplets, triplets. 5 runs of SVT longest 8 beats.   Rare ventricular ectopy in the form of isolated PVCs, couplets   Reported symptoms correlated with normal sinus rhythm   Patch Wear Time:  6 days and 19 hours (2024-10-23T11:04:12-398 to 2024-10-30T06:54:41-0400)   Patient had a min HR of 47 bpm, max HR of 182 bpm, and avg HR of 73 bpm. Predominant underlying rhythm was Sinus Rhythm. 5 Supraventricular Tachycardia runs occurred, the run with the fastest interval lasting 5 beats with a max rate of 182 bpm, the  longest lasting 8 beats with an avg rate of 125 bpm.  Isolated SVEs were rare (<1.0%), SVE Couplets were rare (<1.0%), and SVE Triplets were rare (<1.0%). Isolated VEs were rare (<1.0%), VE Couplets were rare (<1.0%), and no VE Triplets were present.    Physical Exam:   VS:  BP 126/72   Pulse 74   Ht 5' 4 (1.626 m)   Wt 182 lb 3.2 oz (82.6 kg)   LMP 05/22/2021 (Approximate)   SpO2 99%   BMI  31.27 kg/m    Wt Readings from Last 3 Encounters:  10/11/23 182 lb 3.2 oz (82.6 kg)  04/12/23 177 lb 3.2 oz (80.4 kg)  02/07/23 169 lb 9.6 oz (76.9 kg)    GEN: Well nourished, well developed in no acute distress NECK: No JVD; No carotid bruits CARDIAC: S1/S2, RRR, no murmurs, rubs, gallops RESPIRATORY:  Clear to auscultation without rales, wheezing or rhonchi  ABDOMEN: Soft, non-tender, non-distended EXTREMITIES:  No edema; No deformity   ASSESSMENT AND PLAN: .    Palpitations Have significantly resolved since starting hormone patch. Will change metoprolol  to metoprolol  succinate 25 mg daily for her to wean off further. She verbalized understanding of instructions.  No other medication changes at this time. Heart healthy diet and regular cardiovascular exercise encouraged.  Continue to follow-up with therapist and PCP.  I spent a total duration of 20 minutes reviewing prior notes, reviewing outside records including  labs, face-to-face counseling of medical condition, pathophysiology, evaluation, management, and documenting the findings in the note.   Dispo: Follow-up with MD/APP in 1 year or sooner if anything changes.  Signed, Almarie Crate, NP

## 2023-10-11 NOTE — Patient Instructions (Addendum)
 Medication Instructions:  Your physician has recommended you make the following change in your medication:  Please stop Lopressor   Please start Metoprolol  succinate 25 Mg daily   Labwork: None   Testing/Procedures: None   Follow-Up: Your physician recommends that you schedule a follow-up appointment in: 1 Year   Any Other Special Instructions Will Be Listed Below (If Applicable).  If you need a refill on your cardiac medications before your next appointment, please call your pharmacy.

## 2023-12-18 ENCOUNTER — Other Ambulatory Visit: Payer: Self-pay | Admitting: Gastroenterology

## 2024-01-03 ENCOUNTER — Encounter: Payer: Self-pay | Admitting: Gastroenterology

## 2024-01-03 ENCOUNTER — Other Ambulatory Visit: Payer: Self-pay | Admitting: *Deleted

## 2024-01-03 ENCOUNTER — Encounter: Payer: Self-pay | Admitting: *Deleted

## 2024-01-03 ENCOUNTER — Ambulatory Visit: Admitting: Gastroenterology

## 2024-01-03 VITALS — BP 137/77 | HR 60 | Temp 98.5°F | Ht 64.0 in | Wt 191.0 lb

## 2024-01-03 DIAGNOSIS — K760 Fatty (change of) liver, not elsewhere classified: Secondary | ICD-10-CM

## 2024-01-03 DIAGNOSIS — K5190 Ulcerative colitis, unspecified, without complications: Secondary | ICD-10-CM

## 2024-01-03 DIAGNOSIS — K219 Gastro-esophageal reflux disease without esophagitis: Secondary | ICD-10-CM

## 2024-01-03 MED ORDER — NA SULFATE-K SULFATE-MG SULF 17.5-3.13-1.6 GM/177ML PO SOLN: ORAL | 0 refills | Status: AC

## 2024-01-03 NOTE — Progress Notes (Signed)
 GI Office Note    Referring Provider: Toribio Jerel MATSU, MD Primary Care Physician:  Toribio Jerel MATSU, MD  Primary Gastroenterologist: Ozell Hollingshead, MD   Chief Complaint   Chief Complaint  Patient presents with   Follow-up    Follow up before colonoscopy    History of Present Illness   Debra Taylor is a 58 y.o. female presenting today for follow up. Last seen 10//2024. H/o UC, diagnosed 23 years ago. Surveillance colonoscopy due in 02/2024.    She is doing very well today. Her previous symptoms of bloating, gas, chest discomfort all went away after she starting HRT with estrogen patches. She states her anxiety is much better as well.   Her BMs are regular, daily to every other day. No constipation. No diarrhea. No blood in the stool. Appetite is good. Reflux is controlled. No dysphagia.   Wt Readings from Last 3 Encounters:  01/03/24 191 lb (86.6 kg)  10/11/23 182 lb 3.2 oz (82.6 kg)  04/12/23 177 lb 3.2 oz (80.4 kg)   Prior Data   CT A/P with contrast 08/2022 IMPRESSION: -No acute findings within the abdomen or pelvis. -Small uterine fibroids.   RUQ U/S 11/2022: IMPRESSION: 1. No cholelithiasis or sonographic evidence for acute cholecystitis. 2. Common bile duct is prominent measuring 8.2 mm. Recommend correlation with LFTs. If there is concern for biliary obstruction, MRCP may be considered. 3. Increased hepatic echogenicity suggestive of steatosis.   MRI/MRCP 12/2022: IMPRESSION: 1. Minimal prominence of the extrahepatic common bile duct measuring up to 0.8 cm in caliber on today's examination, tapering smoothly to the ampulla without calculus or other obstruction identified. This is of doubtful clinical significance in the absence of clinical/biochemical evidence of cholestasis. 2. No gallstones. 3. No acute findings in the abdomen.   Labs negative for celiac and Alpha-gal. Other labs as outlined below.   Egd 10/2022: -normal esophagus -small  hh -gastric polyp s/p bx fundic gland, gastric biopsy with no h.pylori -normal duodenal bulb, second portion of duodenum and third portion of duodenum  Colonoscopy 02/2022: -subtle colonic mucosal changes c/w inactive ulcerative colitis, inactive chronic colitis noted in descending colon and sigmoid colon on bx. -subtle rectal mucosal changes. 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    Medications   Current Outpatient Medications  Medication Sig Dispense Refill   calcium carbonate (TUMS - DOSED IN MG ELEMENTAL CALCIUM) 500 MG chewable tablet Chew 4 tablets by mouth 2 (two) times daily as needed for indigestion or heartburn. Pt takes 1000 mg as needed.     cetirizine (ZYRTEC) 10 MG tablet Take 10 mg by mouth daily as needed for allergies.     cholecalciferol (VITAMIN D3) 25 MCG (1000 UNIT) tablet Take 1,000 Units by mouth daily.     DOTTI 0.025 MG/24HR Place 1 patch onto the skin 2 (two) times a week.     doxycycline (VIBRAMYCIN) 50 MG capsule Take 50 mg by mouth daily.      famotidine (PEPCID) 10 MG tablet Take 10 mg by mouth daily as needed for heartburn or indigestion.     FLUoxetine (PROZAC) 40 MG capsule Take 40 mg by mouth daily. (Patient taking differently: Take 40 mg by mouth daily. Pt is taking 80mg )     fluticasone (FLONASE) 50 MCG/ACT nasal spray Place 1 spray into both nostrils daily as needed for allergies or rhinitis.     loratadine (CLARITIN) 10 MG tablet Take 10 mg by mouth daily  as needed for allergies.     mesalamine  (APRISO ) 0.375 g 24 hr capsule TAKE 4 CAPSULES BY MOUTH DAILY 360 capsule 3   pantoprazole  (PROTONIX ) 40 MG tablet TAKE 1 TABLET BY MOUTH ONCE DAILY AS NEEDED FOR HEARTBURN 30 tablet 11   progesterone (PROMETRIUM) 100 MG capsule Take 100 mg by mouth at bedtime.     rizatriptan (MAXALT-MLT) 10 MG disintegrating tablet Take 10 mg by mouth as needed for migraine.     No current facility-administered medications for this visit.     Allergies   Allergies as of 01/03/2024   (No Known Allergies)     Past Medical History   Past Medical History:  Diagnosis Date   Chest pain    Depression    Hyperlipidemia    Migraine headache    Premenopausal patient    Conctraception   Ulcerative colitis    Onset at age 26    Past Surgical History   Past Surgical History:  Procedure Laterality Date   BIOPSY  09/02/2017   Procedure: BIOPSY;  Surgeon: Shaaron Lamar HERO, MD;  [facility];  Service: Endoscopy;;  ascending colon, transverse,   BIOPSY  01/23/2020   Procedure: BIOPSY;  Surgeon: Shaaron Lamar HERO, MD;  [facility];  Service: Endoscopy;;   BIOPSY  02/15/2022   Procedure: BIOPSY;  Surgeon: Shaaron Lamar HERO, MD;  [facility];  Service: Endoscopy;;   BIOPSY  10/14/2022   Procedure: BIOPSY;  Surgeon: Shaaron Lamar HERO, MD;  [facility];  Service: Endoscopy;;   COLONOSCOPY  08/2009   normal terminal ileum/inflammatory changes of the rectum and colon with panproctocolitis. No dysplasia on bx.    COLONOSCOPY  09/16/2011   RMR: Findings/Biopsies consistent with chronically mild active pan ulcerative colitis-endoscopically mild appearing. Status post segmental biopsy   COLONOSCOPY N/A 08/30/2013   RMR: Endoscopically active distal proctocolitis, comparable biopsies. No dysplasia.   COLONOSCOPY N/A 08/28/2015   Procedure: COLONOSCOPY;  Surgeon: Lamar HERO Shaaron, MD;  [facility];  Service: Endoscopy;  Laterality: N/A;  1215-moved to 1300   COLONOSCOPY N/A 09/02/2017   Procedure: COLONOSCOPY;  Surgeon: Shaaron Lamar HERO, MD;  [facility];  Service: Endoscopy;  Laterality: N/A;  12:00-rescheduled to 6/28 @ 7:30 per Mliss   COLONOSCOPY N/A 01/23/2020   Procedure: COLONOSCOPY;  Surgeon: Shaaron Lamar HERO, MD;  [facility];  Service: Endoscopy;  Laterality: N/A;  9:15   COLONOSCOPY WITH PROPOFOL  N/A 02/15/2022   Procedure: COLONOSCOPY WITH PROPOFOL ;   Surgeon: Shaaron Lamar HERO, MD;  [facility];  Service: Endoscopy;  Laterality: N/A;  11:15am, asa 2   DENTAL SURGERY     ESOPHAGOGASTRODUODENOSCOPY (EGD) WITH PROPOFOL  N/A 10/14/2022   Procedure: ESOPHAGOGASTRODUODENOSCOPY (EGD) WITH PROPOFOL ;  Surgeon: Shaaron Lamar HERO, MD;  [facility];  Service: Endoscopy;  Laterality: N/A;  8:45 am, asa 2   POLYPECTOMY  01/23/2020   Procedure: POLYPECTOMY;  Surgeon: Shaaron Lamar HERO, MD;  [facility];  Service: Endoscopy;;   POLYPECTOMY  10/14/2022   Procedure: POLYPECTOMY;  Surgeon: Shaaron Lamar HERO, MD;  [facility];  Service: Endoscopy;;    Past Family History   Family History  Problem Relation Age of Onset   Uterine cancer Mother    Ovarian cancer Mother    Celiac disease Mother    Heart attack Father    Lung disease Father    Heart attack Brother  Colon cancer Neg Hx    Breast cancer Neg Hx    Lung cancer Neg Hx     Past Social History   Social History   Socioeconomic History   Marital status: Married    Spouse name: Not on file   Number of children: 0   Years of education: Not on file   Highest education level: Not on file  Occupational History   Occupation: Investment Banker, [employer]    Employer: [employer]  Tobacco Use   Smoking status: Never    Passive exposure: Current   Smokeless tobacco: Never  Vaping Use   Vaping status: Never Used  Substance and Sexual Activity   Alcohol  use: Yes    Comment: Occasional   Drug use: No   Sexual activity: Yes  Other Topics Concern   Not on file  Social History Narrative   Married, no children   Social Drivers of Health   Financial Resource Strain: Low Risk  (10/20/2023)   Received from [facility]   Overall Financial Resource Strain (CARDIA)    How hard is it for you to pay for the very basics like food, housing, medical care, and heating?: Not very hard  Food Insecurity: No Food Insecurity (10/20/2023)    Received from [facility]   Hunger Vital Sign    Within the past 12 months, you worried that your food would run out before you got the money to buy more.: Never true    Within the past 12 months, the food you bought just didn't last and you didn't have money to get more.: Never true  Transportation Needs: No Transportation Needs (10/20/2023)   Received from [facility]   PRAPARE - Transportation    Lack of Transportation (Medical): No    Lack of Transportation (Non-Medical): No  Physical Activity: Not on file  Stress: Not on file  Social Connections: Not on file  Intimate Partner Violence: At Risk (11/10/2022)   Received from [facility]   Humiliation, Afraid, Rape, and Kick questionnaire    Within the last year, have you been afraid of your partner or ex-partner?: No    Within the last year, have you been humiliated or emotionally abused in other ways by your partner or ex-partner?: Yes    Within the last year, have you been kicked, hit, slapped, or otherwise physically hurt by your partner or ex-partner?: No    Within the last year, have you been raped or forced to have any kind of sexual activity by your partner or ex-partner?: No    Review of Systems   General: Negative for anorexia, weight loss, fever, chills, fatigue, weakness. ENT: Negative for hoarseness, difficulty swallowing , nasal congestion. CV: Negative for chest pain, angina, palpitations, dyspnea on exertion, peripheral edema.  Respiratory: Negative for dyspnea at rest, dyspnea on exertion, cough, sputum, wheezing.  GI: See history of present illness. GU:  Negative for dysuria, hematuria, urinary incontinence, urinary frequency, nocturnal urination.  Endo: Negative for unusual weight change.     Physical Exam   BP 137/77   Pulse 60   Temp 98.5 F (36.9 C)   Ht 5' 4 (1.626 m)   Wt 191 lb (86.6 kg)   LMP 05/22/2021 (Approximate)   BMI 32.79 kg/m    General: Well-nourished, well-developed in no acute  distress.  Eyes: No icterus. Mouth: Oropharyngeal mucosa moist and pink   Lungs: Clear to auscultation bilaterally.  Heart: Regular rate  and rhythm, no murmurs rubs or gallops.  Abdomen: Bowel sounds are normal, nontender, nondistended, no hepatosplenomegaly or masses,  no abdominal bruits or hernia , no rebound or guarding.  Rectal: not performed Extremities: No lower extremity edema. No clubbing or deformities. Neuro: Alert and oriented x 4   Skin: Warm and dry, no jaundice.   Psych: Alert and cooperative, normal mood and affect.  Labs   Lab Results  Component Value Date   NA 140 02/07/2023   CL 103 02/07/2023   K 4.4 02/07/2023   CO2 26 02/07/2023   BUN 17 02/07/2023   CREATININE 0.65 02/07/2023   EGFR 103 02/07/2023   CALCIUM 9.5 02/07/2023   ALBUMIN 4.0 09/21/2022   GLUCOSE 127 (H) 02/07/2023   Lab Results  Component Value Date   ALT 11 09/21/2022   AST 18 09/21/2022   ALKPHOS 94 09/21/2022   BILITOT 0.4 09/21/2022   Lab Results  Component Value Date   WBC 4.8 09/21/2022   HGB 12.4 09/21/2022   HCT 38.8 09/21/2022   MCV 89 09/21/2022   PLT 285 09/21/2022   Lab Results  Component Value Date   LIPASE 33 09/21/2022    Imaging Studies   No results found.  Assessment/Plan:   GERD: doing well -continue pantoprazole  daily as needed -reinforced anti-reflux measures  UC: clinically appears to be in remission -continue Apriso  1.5g daily -colonoscopy in near future. ASA 2.  I have discussed the risks, alternatives, benefits with regards to but not limited to the risk of reaction to medication, bleeding, infection, perforation and the patient is agreeable to proceed. Written consent to be obtained. -Suprep  Possible hepatic steatosis based on U/S -MR and CT without reported steatosis, history of prominent CBD without stones, lesions and likely insignificant -calculate FIB-4 for baseline with updated labs     [employer]. Ezzard, MHS, PA-C [facility]  [facility]

## 2024-01-03 NOTE — Patient Instructions (Addendum)
 We will be in touch to schedule colonoscopy.   Complete labs in November.   Continue mesalamine  4 capsules by mouth daily.  Continue pantoprazole  40mg  daily as needed.   Hopefully by the next time we see each other I will have finished that book!

## 2024-01-19 ENCOUNTER — Ambulatory Visit (HOSPITAL_COMMUNITY): Admitting: Anesthesiology

## 2024-01-19 ENCOUNTER — Other Ambulatory Visit: Payer: Self-pay

## 2024-01-19 ENCOUNTER — Ambulatory Visit (HOSPITAL_COMMUNITY)
Admission: RE | Admit: 2024-01-19 | Discharge: 2024-01-19 | Disposition: A | Discharge status: Home / Self Care | Attending: Internal Medicine | Admitting: Internal Medicine

## 2024-01-19 ENCOUNTER — Encounter (HOSPITAL_COMMUNITY)
Admission: RE | Disposition: A | Discharge status: Home / Self Care | Payer: Self-pay | Source: Home / Self Care | Attending: Internal Medicine

## 2024-01-19 ENCOUNTER — Encounter (HOSPITAL_COMMUNITY): Payer: Self-pay | Admitting: Internal Medicine

## 2024-01-19 DIAGNOSIS — Z1211 Encounter for screening for malignant neoplasm of colon: Secondary | ICD-10-CM | POA: Insufficient documentation

## 2024-01-19 DIAGNOSIS — K639 Disease of intestine, unspecified: Secondary | ICD-10-CM | POA: Diagnosis not present

## 2024-01-19 DIAGNOSIS — I10 Essential (primary) hypertension: Secondary | ICD-10-CM | POA: Insufficient documentation

## 2024-01-19 DIAGNOSIS — Z91411 Personal history of adult psychological abuse: Secondary | ICD-10-CM | POA: Insufficient documentation

## 2024-01-19 DIAGNOSIS — Z8711 Personal history of peptic ulcer disease: Secondary | ICD-10-CM | POA: Diagnosis not present

## 2024-01-19 DIAGNOSIS — K219 Gastro-esophageal reflux disease without esophagitis: Secondary | ICD-10-CM | POA: Diagnosis not present

## 2024-01-19 DIAGNOSIS — Z7722 Contact with and (suspected) exposure to environmental tobacco smoke (acute) (chronic): Secondary | ICD-10-CM | POA: Insufficient documentation

## 2024-01-19 HISTORY — PX: COLONOSCOPY: SHX5424

## 2024-01-19 SURGERY — COLONOSCOPY
Anesthesia: Monitor Anesthesia Care

## 2024-01-19 MED ORDER — PROPOFOL 500 MG/50ML IV EMUL
INTRAVENOUS | Status: DC | PRN
  Administered 2024-01-19: 100 ug/kg/min via INTRAVENOUS
  Administered 2024-01-19: 180 mg via INTRAVENOUS

## 2024-01-19 MED ORDER — LACTATED RINGERS IV SOLN: INTRAVENOUS | Status: DC

## 2024-01-19 NOTE — Anesthesia Preprocedure Evaluation (Signed)
 Anesthesia Evaluation  Patient identified by MRN, date of birth, ID band Patient awake    Reviewed: Allergy & Precautions, H&P , NPO status , Patient's Chart, lab work & pertinent test results, reviewed documented beta blocker date and time   Airway Mallampati: II  TM Distance: >3 FB Neck ROM: full    Dental no notable dental hx.    Pulmonary neg pulmonary ROS   Pulmonary exam normal breath sounds clear to auscultation       Cardiovascular Exercise Tolerance: Good hypertension, negative cardio ROS  Rhythm:regular Rate:Normal     Neuro/Psych  Headaches PSYCHIATRIC DISORDERS  Depression       GI/Hepatic Neg liver ROS, PUD,GERD  ,,  Endo/Other  negative endocrine ROS    Renal/GU negative Renal ROS  negative genitourinary   Musculoskeletal   Abdominal   Peds  Hematology negative hematology ROS (+)   Anesthesia Other Findings   Reproductive/Obstetrics negative OB ROS                              Anesthesia Physical Anesthesia Plan  ASA: 2  Anesthesia Plan: MAC   Post-op Pain Management:    Induction:   PONV Risk Score and Plan: Propofol  infusion  Airway Management Planned:   Additional Equipment:   Intra-op Plan:   Post-operative Plan:   Informed Consent: I have reviewed the patients History and Physical, chart, labs and discussed the procedure including the risks, benefits and alternatives for the proposed anesthesia with the patient or authorized representative who has indicated his/her understanding and acceptance.     Dental Advisory Given  Plan Discussed with: CRNA  Anesthesia Plan Comments:         Anesthesia Quick Evaluation

## 2024-01-19 NOTE — Discharge Instructions (Signed)
  Colonoscopy Discharge Instructions  Read the instructions outlined below and refer to this sheet in the next few weeks. These discharge instructions provide you with general information on caring for yourself after you leave the hospital. Your doctor may also give you specific instructions. While your treatment has been planned according to the most current medical practices available, unavoidable complications occasionally occur. If you have any problems or questions after discharge, call Dr. Shaaron at [phone]. ACTIVITY You may resume your regular activity, but move at a slower pace for the next 24 hours.  Take frequent rest periods for the next 24 hours.  Walking will help get rid of the air and reduce the bloated feeling in your belly (abdomen).  No driving for 24 hours (because of the medicine (anesthesia) used during the test).   Do not sign any important legal documents or operate any machinery for 24 hours (because of the anesthesia used during the test).  NUTRITION Drink plenty of fluids.  You may resume your normal diet as instructed by your doctor.  Begin with a light meal and progress to your normal diet. Heavy or fried foods are harder to digest and may make you feel sick to your stomach (nauseated).  Avoid alcoholic beverages for 24 hours or as instructed.  MEDICATIONS You may resume your normal medications unless your doctor tells you otherwise.  WHAT YOU CAN EXPECT TODAY Some feelings of bloating in the abdomen.  Passage of more gas than usual.  Spotting of blood in your stool or on the toilet paper.  IF YOU HAD POLYPS REMOVED DURING THE COLONOSCOPY: No aspirin products for 7 days or as instructed.  No alcohol  for 7 days or as instructed.  Eat a soft diet for the next 24 hours.  FINDING OUT THE RESULTS OF YOUR TEST Not all test results are available during your visit. If your test results are not back during the visit, make an appointment with your caregiver to find out the  results. Do not assume everything is normal if you have not heard from your caregiver or the medical facility. It is important for you to follow up on all of your test results.  SEEK IMMEDIATE MEDICAL ATTENTION IF: You have more than a spotting of blood in your stool.  Your belly is swollen (abdominal distention).  You are nauseated or vomiting.  You have a temperature over 101.  You have abdominal pain or discomfort that is severe or gets worse throughout the day.     Your colon looked good today.  It appears you continue to be in remission.  Segmental biopsies of your colon and rectum were taken.  Further recommendations to follow pending review of pathology report  Continue all of your medications without change.

## 2024-01-19 NOTE — Anesthesia Postprocedure Evaluation (Signed)
 Anesthesia Post Note  Patient: Debra Taylor  Procedure(s) Performed: COLONOSCOPY  Patient location during evaluation: Phase II Anesthesia Type: MAC Level of consciousness: awake Pain management: pain level controlled Vital Signs Assessment: post-procedure vital signs reviewed and stable Respiratory status: spontaneous breathing and respiratory function stable Cardiovascular status: blood pressure returned to baseline and stable Postop Assessment: no headache and no apparent nausea or vomiting Anesthetic complications: no Comments: Late entry   No notable events documented.   Last Vitals:  Vitals:   01/19/24 0801 01/19/24 0807  BP: (!) 98/50 (!) 115/58  Pulse: 70 64  Resp: 10 16  Temp: 36.4 C   SpO2: 98% 98%    Last Pain:  Vitals:   01/19/24 0807  TempSrc:   PainSc: 0-No pain                 Yvonna JINNY Bosworth

## 2024-01-19 NOTE — Op Note (Signed)
 [facility] Patient Name: Debra Taylor Procedure Date: 01/19/2024 7:14 AM MRN: [id number] Date of Birth: [birth year] Attending MD: Lamar Ozell Hollingshead , MD, [id number] CSN: [id number] Age: 58 Admit Type: Outpatient Procedure:                Colonoscopy Indications:              High risk colon cancer surveillance: Inflammatory                            bowel disease (unclassified) of 8 (or more) years                            duration with one-third (or more) of the colon                            involved Providers:                Lamar Ozell Hollingshead, MD, Crystal Page, Madelin Hunter, RN Referring MD:              Medicines:                Propofol  per Anesthesia Complications:            No immediate complications. Estimated Blood Loss:     Estimated blood loss was minimal. Procedure:                Pre-Anesthesia Assessment:                           - Prior to the procedure, a History and Physical                            was performed, and patient medications and                            allergies were reviewed. The patient's tolerance of                            previous anesthesia was also reviewed. The risks                            and benefits of the procedure and the sedation                            options and risks were discussed with the patient.                            All questions were answered, and informed consent                            was obtained. Prior Anticoagulants: The patient has  taken no anticoagulant or antiplatelet agents. ASA                            Grade Assessment: II - A patient with mild systemic                            disease. After reviewing the risks and benefits,                            the patient was deemed in satisfactory condition to                            undergo the procedure.                           After obtaining informed consent, the  colonoscope                            was passed under direct vision. Throughout the                            procedure, the patient's blood pressure, pulse, and                            oxygen saturations were monitored continuously. The                            [id number] ([id number]) Colon was introduced through                            the anus and advanced to the the cecum, identified                            by appendiceal orifice and ileocecal valve. The                            colonoscopy was performed without difficulty. The                            patient tolerated the procedure well. The quality                            of the bowel preparation was adequate. The                            ileocecal valve, appendiceal orifice, and rectum                            were photographed. Scope In: 7:37:43 AM Scope Out: 7:55:25 AM Scope Withdrawal Time: 0 hours 9 minutes 23 seconds  Total Procedure Duration: 0 hours 17 minutes 42 seconds  Findings:      The perianal and digital rectal examinations were normal. Colonic mucosa       well-seen. Patient had minimal tiny neovascular vessels scattered  throughout the colon. These were subtle. No erosions ulcerations or       erythema nodularity or polyps found.      Frequent segmental biopsies throughout the colon and rectum were taken       for histologic study.      The exam was otherwise without abnormality on direct and retroflexion       views. Impression:               - The examination was otherwise normal on direct                            and retroflexion views. Subtle neovascular changes                            as described.                           - Topically, patient continues to be in remission. Moderate Sedation:      Moderate (conscious) sedation was personally administered by an       anesthesia professional. The following parameters were monitored: oxygen       saturation, heart rate, blood  pressure, respiratory rate, EKG, adequacy       of pulmonary ventilation, and response to care. Recommendation:           - Patient has a contact number available for                            emergencies. The signs and symptoms of potential                            delayed complications were discussed with the                            patient. Return to normal activities tomorrow.                            Written discharge instructions were provided to the                            patient.                           - Advance diet as tolerated.                           - Continue present medications.                           - Repeat colonoscopy date to be determined after                            pending pathology results are reviewed for                            surveillance based on pathology results.                           -  Return to GI office (date not yet determined). 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 All rights reserved. The codes documented in this report are preliminary and upon coder review may  be revised to meet current compliance requirements. Lamar HERO. Jamareon Shimel, MD Lamar Ozell Hollingshead, MD 01/19/2024 8:07:54 AM This report has been signed electronically. Number of Addenda: 0

## 2024-01-19 NOTE — Transfer of Care (Signed)
 Immediate Anesthesia Transfer of Care Note  Patient: Debra Taylor  Procedure(s) Performed: COLONOSCOPY  Patient Location: Endoscopy Unit  Anesthesia Type: MAC  Level of Consciousness: awake and patient cooperative  Airway & Oxygen Therapy: Patient Spontanous Breathing  Post-op Assessment: Report given to RN and Post -op Vital signs reviewed and stable  Post vital signs: Reviewed and stable  Last Vitals:  Vitals Value Taken Time  BP 98/50 01/19/24 08:01  Temp 36.4 C 01/19/24 08:01  Pulse 70 01/19/24 08:01  Resp 10 01/19/24 08:01  SpO2 98 % 01/19/24 08:01    Last Pain:  Vitals:   01/19/24 0801  TempSrc: Oral  PainSc: 0-No pain      Patients Stated Pain Goal: 7 (01/19/24 0645)  Complications: No notable events documented.

## 2024-01-19 NOTE — Anesthesia Procedure Notes (Signed)
 Date/Time: 01/19/2024 7:32 AM  Performed by: Barbarann Verneita RAMAN, CRNAPre-anesthesia Checklist: Patient identified, Emergency Drugs available, Suction available, Timeout performed and Patient being monitored Patient Re-evaluated:Patient Re-evaluated prior to induction Oxygen Delivery Method: Nasal Cannula

## 2024-01-19 NOTE — H&P (Signed)
 @LOGO @   Gastroenterology Progress Note    Primary Care Physician:  Toribio Jerel MATSU, MD Primary Gastroenterologist:  Dr. Shaaron  Pre-Procedure History & Physical: HPI:  Debra Taylor is a 58 y.o. female here for surveillance colonoscopy.  Greater than 8 History of pan UC in remission.  Past Medical History:  Diagnosis Date   Chest pain    Depression    Hyperlipidemia    Migraine headache    Premenopausal patient    Conctraception   Ulcerative colitis    Onset at age 58    Past Surgical History:  Procedure Laterality Date   BIOPSY  09/02/2017   Procedure: BIOPSY;  Surgeon: Shaaron Lamar HERO, MD;  [facility];  Service: Endoscopy;;  ascending colon, transverse,   BIOPSY  01/23/2020   Procedure: BIOPSY;  Surgeon: Shaaron Lamar HERO, MD;  [facility];  Service: Endoscopy;;   BIOPSY  02/15/2022   Procedure: BIOPSY;  Surgeon: Shaaron Lamar HERO, MD;  [facility];  Service: Endoscopy;;   BIOPSY  10/14/2022   Procedure: BIOPSY;  Surgeon: Shaaron Lamar HERO, MD;  [facility];  Service: Endoscopy;;   COLONOSCOPY  08/2009   normal terminal ileum/inflammatory changes of the rectum and colon with panproctocolitis. No dysplasia on bx.    COLONOSCOPY  09/16/2011   RMR: Findings/Biopsies consistent with chronically mild active pan ulcerative colitis-endoscopically mild appearing. Status post segmental biopsy   COLONOSCOPY N/A 08/30/2013   RMR: Endoscopically active distal proctocolitis, comparable biopsies. No dysplasia.   COLONOSCOPY N/A 08/28/2015   Procedure: COLONOSCOPY;  Surgeon: Lamar HERO Shaaron, MD;  [facility];  Service: Endoscopy;  Laterality: N/A;  1215-moved to 1300   COLONOSCOPY N/A 09/02/2017   Procedure: COLONOSCOPY;  Surgeon: Shaaron Lamar HERO, MD;  [facility];  Service: Endoscopy;  Laterality: N/A;  12:00-rescheduled to 6/28 @ 7:30 per Mliss   COLONOSCOPY N/A 01/23/2020   Procedure: COLONOSCOPY;  Surgeon: Shaaron Lamar HERO, MD;  [facility];  Service: Endoscopy;  Laterality: N/A;  9:15   COLONOSCOPY WITH PROPOFOL  N/A 02/15/2022   Procedure: COLONOSCOPY WITH PROPOFOL ;  Surgeon: Shaaron Lamar HERO, MD;  [facility];  Service: Endoscopy;  Laterality: N/A;  11:15am, asa 2   DENTAL SURGERY     ESOPHAGOGASTRODUODENOSCOPY (EGD) WITH PROPOFOL  N/A 10/14/2022   Procedure: ESOPHAGOGASTRODUODENOSCOPY (EGD) WITH PROPOFOL ;  Surgeon: Shaaron Lamar HERO, MD;  [facility];  Service: Endoscopy;  Laterality: N/A;  8:45 am, asa 2   POLYPECTOMY  01/23/2020   Procedure: POLYPECTOMY;  Surgeon: Shaaron Lamar HERO, MD;  [facility];  Service: Endoscopy;;   POLYPECTOMY  10/14/2022   Procedure: POLYPECTOMY;  Surgeon: Shaaron Lamar HERO, MD;  [facility];  Service: Endoscopy;;    Prior to Admission medications   Medication Sig Start Date End Date Taking? Authorizing Provider  calcium carbonate (TUMS - DOSED IN MG ELEMENTAL CALCIUM) 500 MG chewable tablet Chew 4 tablets by mouth 2 (two) times daily as needed for indigestion or heartburn. Pt takes 1000 mg as needed.   Yes [provider]  Calcium-Cholecalciferol-Zinc (VIACTIV CALCIUM IMMUNE) 650-20-5.5 MG-MCG-MG CHEW Chew 1 tablet by mouth daily.   Yes [provider]  DOTTI 0.025 MG/24HR Place 1 patch onto the skin 2 (two) times a week. 09/21/23  Yes [provider]  doxycycline (VIBRAMYCIN) 50 MG capsule Take 50 mg by mouth daily.  07/24/15  Yes [provider]  FLUoxetine (  PROZAC) 40 MG capsule Take 40 mg by mouth daily. Patient taking differently: Take 40 mg by mouth daily. Pt is taking 80mg    Yes [provider]  fluticasone (FLONASE) 50 MCG/ACT nasal spray Place 1 spray into both nostrils daily as needed for allergies or rhinitis.   Yes [provider]  mesalamine  (APRISO ) 0.375 g 24 hr capsule TAKE 4 CAPSULES BY MOUTH DAILY 05/11/23  Yes Lewis, Leslie S, PA-C  pantoprazole  (PROTONIX ) 40 MG  tablet TAKE 1 TABLET BY MOUTH ONCE DAILY AS NEEDED FOR HEARTBURN 12/19/23  Yes Ezzard Sonny RAMAN, PA-C  progesterone (PROMETRIUM) 100 MG capsule Take 100 mg by mouth at bedtime. 09/21/23  Yes [provider]  rizatriptan (MAXALT-MLT) 10 MG disintegrating tablet Take 10 mg by mouth as needed for migraine. 09/18/18  Yes [provider]  cetirizine (ZYRTEC) 10 MG tablet Take 10 mg by mouth daily as needed for allergies. Patient not taking: Reported on 01/19/2024    [provider]  cholecalciferol (VITAMIN D3) 25 MCG (1000 UNIT) tablet Take 1,000 Units by mouth daily. Patient not taking: Reported on 01/19/2024    [provider]  famotidine (PEPCID) 10 MG tablet Take 10 mg by mouth daily as needed for heartburn or indigestion.    [provider]  loratadine (CLARITIN) 10 MG tablet Take 10 mg by mouth daily as needed for allergies.    [provider]  Na Sulfate-K Sulfate-Mg Sulfate concentrate (SUPREP) 17.5-3.13-1.6 GM/177ML SOLN As directed 01/03/24   Brandalyn Harting, Lamar HERO, MD    Allergies as of 01/03/2024   (No Known Allergies)    Family History  Problem Relation Age of Onset   Uterine cancer Mother    Ovarian cancer Mother    Celiac disease Mother    Heart attack Father    Lung disease Father    Heart attack Brother    Colon cancer Neg Hx    Breast cancer Neg Hx    Lung cancer Neg Hx     Social History   Socioeconomic History   Marital status: Married    Spouse name: Not on file   Number of children: 0   Years of education: Not on file   Highest education level: Not on file  Occupational History   Occupation: [employer] [place]    Employer: [employer]  Tobacco Use   Smoking status: Never    Passive exposure: Current   Smokeless tobacco: Never  Vaping Use   Vaping status: Never Used  Substance and Sexual Activity   Alcohol  use: Yes    Comment: Occasional   Drug use: No   Sexual activity:  Yes  Other Topics Concern   Not on file  Social History Narrative   Married, no children   Social Drivers of Corporate Investment Banker Strain: Low Risk (10/20/2023)   Received from [facility]   Overall Financial Resource Strain (CARDIA)    How hard is it for you to pay for the very basics like food, housing, medical care, and heating?: Not very hard  Food Insecurity: No Food Insecurity (10/20/2023)   Received from [facility]   Hunger Vital Sign    Within the past 12 months, you worried that your food would run out before you got the money to buy more.: Never true    Within the past 12 months, the food you bought just didn't last and you didn't have money to get more.: Never true  Transportation Needs: No Transportation Needs (10/20/2023)   Received from [facility]    Lack of Transportation (Medical): No    Lack of Transportation (Non-Medical): No  Physical Activity: Not on file  Stress: Not on file  Social Connections: Not on file  Intimate Partner Violence: At Risk (11/10/2022)   Received from [facility]   Humiliation, Afraid, Rape, and Kick questionnaire    Within the last year, have you been afraid of your partner or ex-partner?: No    Within the last year, have you been humiliated or emotionally abused in other ways by your partner or ex-partner?: Yes    Within the last year, have you been kicked, hit, slapped, or otherwise physically hurt by your partner or ex-partner?: No    Within the last year, have you been raped or forced to have any kind of sexual activity by your partner or ex-partner?: No    Review of Systems   See HPI, otherwise negative ROS  Physical Exam: BP 131/88   Pulse 75   Temp 98.2 F (36.8 C) (Oral)   Resp 14   LMP 05/22/2021 (Approximate)   SpO2 100%  General:   Alert,  Well-developed, well-nourished, pleasant and cooperative in NAD Neck:  Supple; no masses or thyromegaly. No significant cervical  adenopathy. Lungs:  Clear throughout to auscultation.   No wheezes, crackles, or rhonchi. No acute distress. Heart:  Regular rate and rhythm; no murmurs, clicks, rubs,  or gallops. Abdomen: Non-distended, normal bowel sounds.  Soft and nontender without appreciable mass or hepatosplenomegaly.  Impression/Plan:   58 year old lady with a plus year history of pancolitis here for surveillance examination.  The risks, benefits, limitations, alternatives and imponderables have been reviewed with the patient. Questions have been answered. All parties are agreeable.     Notice: This dictation was prepared with Dragon dictation along with smaller phrase technology. Any transcriptional errors that result from this process are unintentional and may not be corrected upon review.

## 2024-01-20 ENCOUNTER — Encounter (HOSPITAL_COMMUNITY): Payer: Self-pay | Admitting: Internal Medicine

## 2024-01-20 LAB — SURGICAL PATHOLOGY

## 2024-01-22 ENCOUNTER — Ambulatory Visit: Payer: Self-pay | Admitting: Internal Medicine

## 2024-03-09 ENCOUNTER — Telehealth: Payer: Self-pay | Admitting: Gastroenterology

## 2024-03-09 NOTE — Telephone Encounter (Signed)
 Please let pt know that I have reviewed her labs from last month, done at [facility].  Her LFTs are normal. I calculated her FIB4 (liver fibrosis risk score) and her value is low risk. We can monitor this every 1-2 years with routine labs completed at her physical. She can keep me posted when she has physical labs each year.  Her Hgb is slightly low with normal Hct. Iron and ferritin are low. Recent colonoscopy reassuring.  Is she consuming iron rich diet? ' Is she donating blood? Is she taking any NSAIDS (ibuprofen , aleve, aspirin powders, etc) Would advise her to start ferrous sulfate 325mg  three times each week. Update cbc, iron/tibc/ferritin/b12/folate in 2 months. If she chooses to have done at [facility] she should send my a mychart message two days after completed.

## 2024-03-11 ENCOUNTER — Ambulatory Visit
Admission: RE | Admit: 2024-03-11 | Discharge: 2024-03-11 | Disposition: A | Discharge status: Home / Self Care | Source: Ambulatory Visit

## 2024-03-11 VITALS — BP 131/90 | HR 85 | Temp 99.9°F | Resp 17 | Ht 64.0 in | Wt 185.0 lb

## 2024-03-11 DIAGNOSIS — U071 COVID-19: Secondary | ICD-10-CM | POA: Diagnosis not present

## 2024-03-11 HISTORY — DX: Iron deficiency: E61.1

## 2024-03-11 LAB — POC SOFIA SARS ANTIGEN FIA: SARS Coronavirus 2 Ag: POSITIVE — AB

## 2024-03-11 LAB — POCT INFLUENZA A/B
Influenza A, POC: NEGATIVE
Influenza B, POC: NEGATIVE

## 2024-03-11 MED ORDER — AZELASTINE HCL 0.1 % NA SOLN: 2.0000 | Freq: Two times a day (BID) | NASAL | 0 refills | Status: AC

## 2024-03-11 MED ORDER — PROMETHAZINE-DM 6.25-15 MG/5ML PO SYRP: 5.0000 mL | ORAL_SOLUTION | Freq: Four times a day (QID) | ORAL | 0 refills | Status: AC | PRN

## 2024-03-11 MED ORDER — PAXLOVID (300/100) 20 X 150 MG & 10 X 100MG PO TBPK: 3.0000 | ORAL_TABLET | Freq: Two times a day (BID) | ORAL | 0 refills | Status: AC

## 2024-03-11 NOTE — ED Triage Notes (Signed)
 Pt states that she has a cough, chest congestion, head pressure and body aches. X2 days  Pt states that she has been taking Delsym, Robitussin and Tylenol  otc.

## 2024-03-11 NOTE — Discharge Instructions (Addendum)
 The influenza test was negative.  Your COVID test was positive. Take medication as prescribed. Increase fluids and allow for plenty of rest. You may take over-the-counter Tylenol  as needed for pain, fever, or general discomfort. Recommend the use of normal saline nasal spray throughout the day for nasal congestion or runny nose. For your cough, you may find it helpful to use a humidifier in your bedroom at nighttime during sleep and sleeping elevated on pillows while symptoms persist. You should remain home until you have been fever free for 24 hours with no medication. Go to the emergency department if you develop chest pain, shortness of breath, difficulty breathing, or other concerns. Follow-up with your primary care physician if symptoms fail to improve. Follow-up as needed.

## 2024-03-11 NOTE — ED Provider Notes (Signed)
 " [facility]    CSN: [id number] Arrival date & time: 03/11/24  1017      History   Chief Complaint Chief Complaint  Patient presents with   Cough    Chest congestionHead congestion Possible fever - Entered by patient    HPI Debra Taylor is a 59 y.o. female.   The history is provided by the patient.   Patient presents with a 2-day history of cough, chest congestion, head congestion, and bodyaches.  Patient states she is unsure if she had a fever last evening.  She denies ear pain, ear drainage, wheezing, difficulty breathing, chest pain, abdominal pain, nausea, vomiting, diarrhea, or rash.  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  So far, states she has been taking Delsym, Robitussin, and Tylenol  for her symptoms.  Past Medical History:  Diagnosis Date   Chest pain    Depression    Hyperlipidemia    Iron deficiency    Migraine headache    Premenopausal patient    Conctraception   Ulcerative colitis    Onset at age 65    Patient Active Problem List   Diagnosis Date Noted   Fatty liver 01/03/2024   Palpitations 12/21/2022   Upper abdominal pain 11/24/2022   Indigestion 09/20/2022   Bloating 09/20/2022   GERD (gastroesophageal reflux disease) 09/20/2022   Change in bowel function 08/25/2022   Lower abdominal pain 08/25/2022   Constipation 08/25/2022   Ulcerative colitis without complications (HCC)    Amenorrhea 08/24/2011   Yeast infection of the vagina 08/24/2011   HYPERLIPIDEMIA 04/24/2010   CHEST PAIN 04/24/2010   DEPRESSION 09/16/2008   Ulcerative colitis (HCC) 09/16/2008    Past Surgical History:  Procedure Laterality Date   BIOPSY  09/02/2017   Procedure: BIOPSY;  Surgeon: Shaaron Lamar HERO, MD;  [facility];  Service: Endoscopy;;  ascending colon, transverse,   BIOPSY  01/23/2020   Procedure: BIOPSY;  Surgeon: Shaaron Lamar HERO, MD;  [facility];  Service: Endoscopy;;    BIOPSY  02/15/2022   Procedure: BIOPSY;  Surgeon: Shaaron Lamar HERO, MD;  [facility];  Service: Endoscopy;;   BIOPSY  10/14/2022   Procedure: BIOPSY;  Surgeon: Shaaron Lamar HERO, MD;  [facility];  Service: Endoscopy;;   COLONOSCOPY  08/2009   normal terminal ileum/inflammatory changes of the rectum and colon with panproctocolitis. No dysplasia on bx.    COLONOSCOPY  09/16/2011   RMR: Findings/Biopsies consistent with chronically mild active pan ulcerative colitis-endoscopically mild appearing. Status post segmental biopsy   COLONOSCOPY N/A 08/30/2013   RMR: Endoscopically active distal proctocolitis, comparable biopsies. No dysplasia.   COLONOSCOPY N/A 08/28/2015   Procedure: COLONOSCOPY;  Surgeon: Lamar HERO Shaaron, MD;  [facility];  Service: Endoscopy;  Laterality: N/A;  1215-moved to 1300   COLONOSCOPY N/A 09/02/2017   Procedure: COLONOSCOPY;  Surgeon: Shaaron Lamar HERO, MD;  [facility];  Service: Endoscopy;  Laterality: N/A;  12:00-rescheduled to 6/28 @ 7:30 per Mliss   COLONOSCOPY N/A 01/23/2020   Procedure: COLONOSCOPY;  Surgeon: Shaaron Lamar HERO, MD;  [facility];  Service: Endoscopy;  Laterality: N/A;  9:15   COLONOSCOPY N/A 01/19/2024   Procedure: COLONOSCOPY;  Surgeon: Shaaron Lamar HERO, MD;  [facility];  Service: Endoscopy;  Laterality: N/A;  7:30 am, asa 2   COLONOSCOPY WITH PROPOFOL  N/A 02/15/2022   Procedure: COLONOSCOPY WITH PROPOFOL ;  Surgeon: Shaaron Lamar HERO, MD;  [facility];  Service: Endoscopy;  Laterality: N/A;  11:15am, asa 2   DENTAL SURGERY     ESOPHAGOGASTRODUODENOSCOPY (EGD) WITH PROPOFOL  N/A 10/14/2022   Procedure: ESOPHAGOGASTRODUODENOSCOPY (EGD) WITH PROPOFOL ;  Surgeon: Shaaron Lamar HERO, MD;  [facility];  Service: Endoscopy;  Laterality: N/A;  8:45 am, asa 2   POLYPECTOMY  01/23/2020   Procedure: POLYPECTOMY;  Surgeon: Shaaron Lamar HERO, MD;  [facility];  Service:  Endoscopy;;   POLYPECTOMY  10/14/2022   Procedure: POLYPECTOMY;  Surgeon: Shaaron Lamar HERO, MD;  [facility];  Service: Endoscopy;;    OB History   No obstetric history on file.      Home Medications    Prior to Admission medications  Medication Sig Start Date End Date Taking? Authorizing Provider  azelastine  (ASTELIN ) 0.1 % nasal spray Place 2 sprays into both nostrils 2 (two) times daily. Use in each nostril as directed 03/11/24  Yes Leath-Warren, Etta PARAS, NP  calcium carbonate (TUMS - DOSED IN MG ELEMENTAL CALCIUM) 500 MG chewable tablet Chew 4 tablets by mouth 2 (two) times daily as needed for indigestion or heartburn. Pt takes 1000 mg as needed.   Yes [provider]  Calcium-Cholecalciferol-Zinc (VIACTIV CALCIUM IMMUNE) 650-20-5.5 MG-MCG-MG CHEW Chew 1 tablet by mouth daily.   Yes [provider]  cetirizine (ZYRTEC) 10 MG tablet Take 10 mg by mouth daily as needed for allergies.   Yes [provider]  cholecalciferol (VITAMIN D3) 25 MCG (1000 UNIT) tablet Take 1,000 Units by mouth daily.   Yes [provider]  DOTTI 0.025 MG/24HR Place 1 patch onto the skin 2 (two) times a week. 09/21/23  Yes [provider]  famotidine (PEPCID) 10 MG tablet Take 10 mg by mouth daily as needed for heartburn or indigestion.   Yes [provider]  ferrous sulfate 325 (65 FE) MG EC tablet Take 325 mg by mouth.   Yes [provider]  FLUoxetine (PROZAC) 40 MG capsule Take 40 mg by mouth daily. Patient taking differently: Take 40 mg by mouth daily. Pt is taking 80mg    Yes [provider]  fluticasone (FLONASE) 50 MCG/ACT nasal spray Place 1 spray into both nostrils daily as needed for allergies or rhinitis.   Yes [provider]  loratadine (CLARITIN) 10 MG tablet Take 10 mg by mouth daily as needed for allergies.   Yes [provider]  mesalamine  (APRISO ) 0.375 g 24 hr capsule TAKE 4 CAPSULES BY MOUTH  DAILY 05/11/23  Yes Ezzard Sonny RAMAN, PA-C  Na Sulfate-K Sulfate-Mg Sulfate concentrate (SUPREP) 17.5-3.13-1.6 GM/177ML SOLN As directed 01/03/24  Yes Rourk, Lamar HERO, MD  nirmatrelvir/ritonavir (PAXLOVID , 300/100,) 20 x 150 MG & 10 x 100MG  TBPK Take 3 tablets by mouth 2 (two) times daily for 5 days. Patient GFR is > 60.Take nirmatrelvir (150 mg) two tablets twice daily for 5 days and ritonavir (100 mg) one tablet twice daily for 5 days. 03/11/24 03/16/24 Yes Leath-Warren, Etta PARAS, NP  pantoprazole  (PROTONIX ) 40 MG tablet TAKE 1 TABLET BY MOUTH ONCE DAILY AS NEEDED FOR HEARTBURN 12/19/23  Yes Ezzard Sonny RAMAN, PA-C  progesterone (PROMETRIUM) 100 MG capsule Take 100 mg by mouth at bedtime. 09/21/23  Yes [provider]  promethazine -dextromethorphan (PROMETHAZINE -DM) 6.25-15 MG/5ML syrup Take 5 mLs by mouth 4 (four) times daily as needed. 03/11/24  Yes Leath-Warren, Etta PARAS, NP  rizatriptan (MAXALT-MLT) 10 MG disintegrating tablet Take 10 mg  by mouth as needed for migraine. 09/18/18  Yes [provider]  doxycycline (VIBRAMYCIN) 50 MG capsule Take 50 mg by mouth daily.  07/24/15   [provider]    Family History Family History  Problem Relation Age of Onset   Uterine cancer Mother    Ovarian cancer Mother    Celiac disease Mother    Heart attack Father    Lung disease Father    Heart attack Brother    Colon cancer Neg Hx    Breast cancer Neg Hx    Lung cancer Neg Hx     Social History Social History[1]   Allergies   Patient has no known allergies.   Review of Systems Review of Systems Per HPI  Physical Exam Triage Vital Signs ED Triage Vitals  Encounter Vitals Group     BP 03/11/24 1059 (!) 131/90     Girls Systolic BP Percentile --      Girls Diastolic BP Percentile --      Boys Systolic BP Percentile --      Boys Diastolic BP Percentile --      Pulse Rate 03/11/24 1059 85     Resp 03/11/24 1059 17     Temp 03/11/24 1059 99.9 F (37.7 C)     Temp  Source 03/11/24 1059 Oral     SpO2 03/11/24 1059 98 %     Weight 03/11/24 1058 185 lb (83.9 kg)     Height 03/11/24 1058 5' 4 (1.626 m)     Head Circumference --      Peak Flow --      Pain Score 03/11/24 1057 3     Pain Loc --      Pain Education --      Exclude from Growth Chart --    No data found.  Updated Vital Signs BP (!) 131/90 (BP Location: Right Arm)   Pulse 85   Temp 99.9 F (37.7 C) (Oral)   Resp 17   Ht 5' 4 (1.626 m)   Wt 185 lb (83.9 kg)   LMP 05/22/2021   SpO2 98%   BMI 31.76 kg/m   Visual Acuity Right Eye Distance:   Left Eye Distance:   Bilateral Distance:    Right Eye Near:   Left Eye Near:    Bilateral Near:     Physical Exam Vitals and nursing note reviewed.  Constitutional:      General: She is not in acute distress.    Appearance: Normal appearance.  HENT:     Head: Normocephalic.     Right Ear: Tympanic membrane, ear canal and external ear normal.     Left Ear: Tympanic membrane, ear canal and external ear normal.     Nose: Congestion present.     Right Turbinates: Enlarged and swollen.     Left Turbinates: Enlarged and swollen.     Right Sinus: No maxillary sinus tenderness or frontal sinus tenderness.     Left Sinus: No maxillary sinus tenderness or frontal sinus tenderness.     Mouth/Throat:     Lips: Pink.     Mouth: Mucous membranes are moist.     Pharynx: Postnasal drip present. No pharyngeal swelling, oropharyngeal exudate, posterior oropharyngeal erythema or uvula swelling.     Comments: Cobblestoning present to posterior oropharynx  Eyes:     Extraocular Movements: Extraocular movements intact.     Conjunctiva/sclera: Conjunctivae normal.     Pupils: Pupils are equal, round, and reactive to  light.  Cardiovascular:     Rate and Rhythm: Normal rate and regular rhythm.     Pulses: Normal pulses.     Heart sounds: Normal heart sounds.  Pulmonary:     Effort: Pulmonary effort is normal. No respiratory distress.     Breath  sounds: Normal breath sounds. No stridor. No wheezing, rhonchi or rales.  Abdominal:     General: Bowel sounds are normal.     Palpations: Abdomen is soft.     Tenderness: There is no abdominal tenderness.  Musculoskeletal:     Cervical back: Normal range of motion.  Lymphadenopathy:     Cervical: No cervical adenopathy.  Skin:    General: Skin is warm and dry.  Neurological:     General: No focal deficit present.     Mental Status: She is alert and oriented to person, place, and time.  Psychiatric:        Mood and Affect: Mood normal.        Behavior: Behavior normal.      UC Treatments / Results  Labs (all labs ordered are listed, but only abnormal results are displayed) Labs Reviewed  POC SOFIA SARS ANTIGEN FIA - Abnormal; Notable for the following components:      Result Value   SARS Coronavirus 2 Ag Positive (*)    All other components within normal limits  POCT INFLUENZA A/B - Normal    EKG   Radiology No results found.  Procedures Procedures (including critical care time)  Medications Ordered in UC Medications - No data to display  Initial Impression / Assessment and Plan / UC Course  I have reviewed the triage vital signs and the nursing notes.  Pertinent labs & imaging results that were available during my care of the patient were reviewed by me and considered in my medical decision making (see chart for details).  Influenza test was negative, COVID test was positive.  Patient has elected to begin Paxlovid  (patient denies history of kidney disease, use of blood thinning medications, or use of statins).  Symptomatic treatment provided with Promethazine  DM for the cough and azelastine  nasal spray for nasal congestion or runny nose.  Supportive care recommendations were provided discussed with the patient to include fluids, rest, over-the-counter analgesics, and use of a humidifier during sleep.  Discussed indications with patient regarding follow-up along with  strict ER follow-up precautions.  Patient was in agreement with this plan of care and verbalizes understanding.  All questions were answered.  Patient stable for discharge.  Work note was provided.  Final Clinical Impressions(s) / UC Diagnoses   Final diagnoses:  COVID     Discharge Instructions      The influenza test was negative.  Your COVID test was positive. Take medication as prescribed. Increase fluids and allow for plenty of rest. You may take over-the-counter Tylenol  as needed for pain, fever, or general discomfort. Recommend the use of normal saline nasal spray throughout the day for nasal congestion or runny nose. For your cough, you may find it helpful to use a humidifier in your bedroom at nighttime during sleep and sleeping elevated on pillows while symptoms persist. You should remain home until you have been fever free for 24 hours with no medication. Go to the emergency department if you develop chest pain, shortness of breath, difficulty breathing, or other concerns. Follow-up with your primary care physician if symptoms fail to improve. Follow-up as needed.     ED Prescriptions  Medication Sig Dispense Auth. Provider   nirmatrelvir/ritonavir (PAXLOVID , 300/100,) 20 x 150 MG & 10 x 100MG  TBPK Take 3 tablets by mouth 2 (two) times daily for 5 days. Patient GFR is > 60.Take nirmatrelvir (150 mg) two tablets twice daily for 5 days and ritonavir (100 mg) one tablet twice daily for 5 days. 30 tablet Leath-Warren, Etta PARAS, NP   promethazine -dextromethorphan (PROMETHAZINE -DM) 6.25-15 MG/5ML syrup Take 5 mLs by mouth 4 (four) times daily as needed. 118 mL Leath-Warren, Etta PARAS, NP   azelastine  (ASTELIN ) 0.1 % nasal spray Place 2 sprays into both nostrils 2 (two) times daily. Use in each nostril as directed 30 mL Leath-Warren, Etta PARAS, NP      PDMP not reviewed this encounter.    [1]  Social History Tobacco Use   Smoking status: Never    Passive exposure:  Current   Smokeless tobacco: Never  Vaping Use   Vaping status: Never Used  Substance Use Topics   Alcohol  use: Yes    Comment: Occasional   Drug use: No     Gilmer Etta PARAS, NP 03/11/24 1126  "

## 2024-03-12 NOTE — Telephone Encounter (Signed)
Lmom for return call.  

## 2024-03-15 NOTE — Telephone Encounter (Signed)
 Noted, she stated that she will let us  know when she completes the labs with hematology in a couple weeks to see if we need to do anything else.

## 2024-03-15 NOTE — Telephone Encounter (Signed)
 Pt was made aware and verbalized understanding. Pt states that she is seeing a hematologist and is supposed to be going for labs in a couple of weeks. They are discussing the possibility of iron infusions. Pt is currently on an iron supplement. She was donating blood but had to stop due to low hgb. She is going to message us  when she has labs done with hematology and will also let us  know if she hasn't had any in .

## 2024-03-15 NOTE — Telephone Encounter (Signed)
Lmom for return call.  

## 2024-03-15 NOTE — Telephone Encounter (Signed)
 Ok. She may have depleted her iron stores if donating blood regularly. Continue to share labs with us  so that we can follow. If any signs of persistent or worsening iron deficiency anemia she may require additional GI work up such as small bowel capsule.   Would be a good idea to also check B12/folate as outlined below if not done by hematology.

## 2024-10-11 ENCOUNTER — Ambulatory Visit: Admitting: Nurse Practitioner
